# Patient Record
Sex: Female | Born: 1980 | Race: Black or African American | Hispanic: Yes | Marital: Single | Smoking: Current every day smoker
Health system: Southern US, Community
[De-identification: ages and names within clinical notes are randomized; demographics above are authoritative.]

## PROBLEM LIST (undated history)

## (undated) ENCOUNTER — Inpatient Hospital Stay: Payer: Self-pay

## (undated) ENCOUNTER — Emergency Department: Disposition: A | Discharge status: Home / Self Care | Payer: Self-pay

## (undated) DIAGNOSIS — Z789 Other specified health status: Secondary | ICD-10-CM

---

## 2004-09-06 ENCOUNTER — Emergency Department: Payer: Self-pay | Admitting: Emergency Medicine

## 2004-10-18 ENCOUNTER — Emergency Department: Payer: Self-pay | Admitting: Unknown Physician Specialty

## 2004-10-19 ENCOUNTER — Ambulatory Visit: Payer: Self-pay | Admitting: Unknown Physician Specialty

## 2007-05-14 ENCOUNTER — Observation Stay: Payer: Self-pay | Admitting: Obstetrics and Gynecology

## 2008-04-24 ENCOUNTER — Emergency Department: Payer: Self-pay | Admitting: Emergency Medicine

## 2008-05-27 ENCOUNTER — Emergency Department: Payer: Self-pay | Admitting: Emergency Medicine

## 2008-05-28 ENCOUNTER — Emergency Department: Payer: Self-pay | Admitting: Emergency Medicine

## 2008-12-18 ENCOUNTER — Observation Stay: Payer: Self-pay

## 2008-12-21 ENCOUNTER — Inpatient Hospital Stay: Payer: Self-pay

## 2009-03-26 ENCOUNTER — Emergency Department: Payer: Self-pay | Admitting: Emergency Medicine

## 2009-07-01 ENCOUNTER — Emergency Department: Payer: Self-pay | Admitting: Emergency Medicine

## 2010-10-13 ENCOUNTER — Emergency Department: Payer: Self-pay | Admitting: Internal Medicine

## 2011-12-14 ENCOUNTER — Emergency Department: Payer: Self-pay | Admitting: *Deleted

## 2011-12-16 ENCOUNTER — Emergency Department: Payer: Self-pay | Admitting: Emergency Medicine

## 2012-03-17 ENCOUNTER — Emergency Department: Payer: Self-pay | Admitting: *Deleted

## 2012-06-30 ENCOUNTER — Emergency Department (HOSPITAL_COMMUNITY)
Admission: EM | Admit: 2012-06-30 | Discharge: 2012-06-30 | Disposition: A | Discharge status: Home / Self Care | Payer: Self-pay | Attending: Emergency Medicine | Admitting: Emergency Medicine

## 2012-06-30 ENCOUNTER — Encounter (HOSPITAL_COMMUNITY): Payer: Self-pay | Admitting: Emergency Medicine

## 2012-06-30 DIAGNOSIS — F17200 Nicotine dependence, unspecified, uncomplicated: Secondary | ICD-10-CM | POA: Insufficient documentation

## 2012-06-30 DIAGNOSIS — K047 Periapical abscess without sinus: Secondary | ICD-10-CM | POA: Insufficient documentation

## 2012-06-30 MED ORDER — PENICILLIN V POTASSIUM 500 MG PO TABS: 500.0000 mg | ORAL_TABLET | Freq: Four times a day (QID) | ORAL | Status: AC

## 2012-06-30 NOTE — ED Notes (Signed)
Pt. Stated, the rt. Side of my face has been swollen for about a month,  My gums are sore also.

## 2012-06-30 NOTE — ED Provider Notes (Signed)
History   This chart was scribed for Wynetta Emery, PA, by Leone Payor, ED Scribe. This patient was seen in room TR10C/TR10C and the patient's care was started at 1445.    CSN: [id number]  Arrival date & time 06/30/12  1341   First MD Initiated Contact with Patient 06/30/12 1445      Chief Complaint  Patient presents with  . Facial Swelling     The history is provided by the patient. No language interpreter was used.    Suzanne Rhodes is a 32 y.o. female who presents to the Emergency Department complaining of gradually worsening, mild, right sided facial swelling with associated gum soreness starting 1 day ago. Pt reports having symptoms for 1 month but the facial swelling became worse last night. She denies fever, ear pain, tooth pain, cough, cuts to affected area.   Pt is a current everyday smoker but denies alcohol use.  History reviewed. No pertinent past medical history.  History reviewed. No pertinent past surgical history.  No family history on file.  History  Substance Use Topics  . Smoking status: Current Every Day Smoker  . Smokeless tobacco: Not on file  . Alcohol Use: No    No OB history provided.   Review of Systems  Constitutional: Negative for fever.  HENT: Negative for ear pain.   Respiratory: Negative for cough and shortness of breath.   Cardiovascular: Negative for chest pain.  Gastrointestinal: Negative for nausea, vomiting, abdominal pain and diarrhea.  Skin: Negative for wound.  All other systems reviewed and are negative.    Allergies  Review of patient's allergies indicates no known allergies.  Home Medications  No current outpatient prescriptions on file.  BP 135/81  Pulse 75  Temp 98.5 F (36.9 C) (Oral)  Resp 16  SpO2 100%  LMP 06/21/2012  Physical Exam  Nursing note and vitals reviewed. Constitutional: She is oriented to person, place, and time. She appears well-developed and well-nourished. No distress.  HENT:  Head:  Normocephalic.  Mouth/Throat:         TMs have good architecture with normal light reflex.   Eyes: Conjunctivae normal and EOM are normal.  Neck:       Anterior cervical focal lymphadenopathy.  Cardiovascular: Normal rate.   Pulmonary/Chest: Effort normal. No stridor.  Musculoskeletal: Normal range of motion.  Neurological: She is alert and oriented to person, place, and time.  Psychiatric: She has a normal mood and affect.    ED Course  Procedures (including critical care time)   DIAGNOSTIC STUDIES: Oxygen Saturation is 100% on room air, normal by my interpretation.    COORDINATION OF CARE: 3:43 PM Discussed treatment plan which includes antibiotics and follow up with dentist with pt at bedside and pt agreed to plan.     Labs Reviewed - No data to display No results found.   1. Dental abscess       MDM  Painless dental abscess with no systemic complaints.  I personally performed the services described in this documentation, which was scribed in my presence. The recorded information has been reviewed and is accurate.   Pt verbalized understanding and agrees with care plan. Outpatient follow-up and return precautions given.    New Prescriptions   PENICILLIN V POTASSIUM (VEETID) 500 MG TABLET    Take 1 tablet (500 mg total) by mouth 4 (four) times daily.    Wynetta Emery, PA-C 06/30/12 1555

## 2012-07-01 NOTE — ED Provider Notes (Signed)
Medical screening examination/treatment/procedure(s) were performed by non-physician practitioner and as supervising physician I was immediately available for consultation/collaboration.   Floy Angert E Chrisanne Loose, MD 07/01/12 1117 

## 2014-02-21 ENCOUNTER — Emergency Department: Payer: Self-pay | Admitting: Emergency Medicine

## 2014-02-21 LAB — CBC WITH DIFFERENTIAL/PLATELET
BASOS ABS: 0.1 10*3/uL (ref 0.0–0.1)
Basophil %: 0.8 %
EOS ABS: 0.1 10*3/uL (ref 0.0–0.7)
Eosinophil %: 1.1 %
HCT: 34.4 % — AB (ref 35.0–47.0)
HGB: 11.4 g/dL — ABNORMAL LOW (ref 12.0–16.0)
LYMPHS ABS: 1.9 10*3/uL (ref 1.0–3.6)
LYMPHS PCT: 14.8 %
MCH: 30.2 pg (ref 26.0–34.0)
MCHC: 33.1 g/dL (ref 32.0–36.0)
MCV: 91 fL (ref 80–100)
Monocyte #: 0.6 x10 3/mm (ref 0.2–0.9)
Monocyte %: 4.6 %
NEUTROS PCT: 78.7 %
Neutrophil #: 9.9 10*3/uL — ABNORMAL HIGH (ref 1.4–6.5)
PLATELETS: 229 10*3/uL (ref 150–440)
RBC: 3.77 10*6/uL — AB (ref 3.80–5.20)
RDW: 13 % (ref 11.5–14.5)
WBC: 12.6 10*3/uL — AB (ref 3.6–11.0)

## 2014-02-21 LAB — URINALYSIS, COMPLETE
BACTERIA: NONE SEEN
BILIRUBIN, UR: NEGATIVE
Glucose,UR: NEGATIVE mg/dL (ref 0–75)
KETONE: NEGATIVE
Nitrite: NEGATIVE
PROTEIN: NEGATIVE
Ph: 6 (ref 4.5–8.0)
RBC,UR: 14 /HPF (ref 0–5)
Specific Gravity: 1.02 (ref 1.003–1.030)
Squamous Epithelial: 12
WBC UR: 46 /HPF (ref 0–5)

## 2014-02-21 LAB — COMPREHENSIVE METABOLIC PANEL
Albumin: 3 g/dL — ABNORMAL LOW (ref 3.4–5.0)
Alkaline Phosphatase: 75 U/L
Anion Gap: 9 (ref 7–16)
BUN: 7 mg/dL (ref 7–18)
Bilirubin,Total: 0.2 mg/dL (ref 0.2–1.0)
CHLORIDE: 108 mmol/L — AB (ref 98–107)
Calcium, Total: 8.2 mg/dL — ABNORMAL LOW (ref 8.5–10.1)
Co2: 22 mmol/L (ref 21–32)
Creatinine: 0.54 mg/dL — ABNORMAL LOW (ref 0.60–1.30)
EGFR (Non-African Amer.): >60
Glucose: 74 mg/dL (ref 65–99)
OSMOLALITY: 274 (ref 275–301)
POTASSIUM: 3.5 mmol/L (ref 3.5–5.1)
SGOT(AST): 27 U/L (ref 15–37)
SGPT (ALT): 35 U/L
SODIUM: 139 mmol/L (ref 136–145)
Total Protein: 7.2 g/dL (ref 6.4–8.2)

## 2014-02-21 LAB — HCG, QUANTITATIVE, PREGNANCY: Beta Hcg, Quant.: 13504 m[IU]/mL — ABNORMAL HIGH

## 2014-02-21 LAB — LIPASE, BLOOD: Lipase: 56 U/L — ABNORMAL LOW (ref 73–393)

## 2014-04-14 ENCOUNTER — Ambulatory Visit: Payer: Self-pay | Admitting: Physician Assistant

## 2014-05-19 ENCOUNTER — Observation Stay: Payer: Self-pay

## 2014-05-25 ENCOUNTER — Inpatient Hospital Stay: Payer: Self-pay | Admitting: Obstetrics and Gynecology

## 2014-05-25 LAB — URINALYSIS, COMPLETE
Blood: NEGATIVE
GLUCOSE, UR: NEGATIVE mg/dL (ref 0–75)
NITRITE: NEGATIVE
Ph: 5 (ref 4.5–8.0)
RBC,UR: 39 /HPF (ref 0–5)
Specific Gravity: 1.032 (ref 1.003–1.030)
Transitional Epi: 1
WBC UR: 276 /HPF (ref 0–5)

## 2014-05-25 LAB — CBC WITH DIFFERENTIAL/PLATELET
BASOS ABS: 0 10*3/uL (ref 0.0–0.1)
BASOS PCT: 0.2 %
EOS ABS: 0.1 10*3/uL (ref 0.0–0.7)
EOS PCT: 0.8 %
HCT: 36.1 % (ref 35.0–47.0)
HGB: 12.1 g/dL (ref 12.0–16.0)
Lymphocyte #: 1.9 10*3/uL (ref 1.0–3.6)
Lymphocyte %: 17 %
MCH: 30.3 pg (ref 26.0–34.0)
MCHC: 33.5 g/dL (ref 32.0–36.0)
MCV: 90 fL (ref 80–100)
MONO ABS: 0.5 x10 3/mm (ref 0.2–0.9)
MONOS PCT: 4.9 %
Neutrophil #: 8.5 10*3/uL — ABNORMAL HIGH (ref 1.4–6.5)
Neutrophil %: 77.1 %
PLATELETS: 240 10*3/uL (ref 150–440)
RBC: 3.99 10*6/uL (ref 3.80–5.20)
RDW: 14.1 % (ref 11.5–14.5)
WBC: 11.1 10*3/uL — ABNORMAL HIGH (ref 3.6–11.0)

## 2014-05-25 LAB — HEMOGLOBIN A1C: Hemoglobin A1C: 5.6 % (ref 4.2–6.3)

## 2014-05-25 LAB — DRUG SCREEN, URINE
AMPHETAMINES, UR SCREEN: NEGATIVE (ref ?–1000)
Barbiturates, Ur Screen: NEGATIVE (ref ?–200)
Benzodiazepine, Ur Scrn: NEGATIVE (ref ?–200)
CANNABINOID 50 NG, UR ~~LOC~~: NEGATIVE (ref ?–50)
COCAINE METABOLITE, UR ~~LOC~~: POSITIVE (ref ?–300)
MDMA (ECSTASY) UR SCREEN: NEGATIVE (ref ?–500)
METHADONE, UR SCREEN: NEGATIVE (ref ?–300)
Opiate, Ur Screen: NEGATIVE (ref ?–300)
PHENCYCLIDINE (PCP) UR S: NEGATIVE (ref ?–25)
TRICYCLIC, UR SCREEN: NEGATIVE (ref ?–1000)

## 2014-05-25 LAB — GC/CHLAMYDIA PROBE AMP

## 2014-05-28 ENCOUNTER — Observation Stay: Payer: Self-pay | Admitting: Obstetrics and Gynecology

## 2014-05-28 LAB — URINALYSIS, COMPLETE
Bilirubin,UR: NEGATIVE
Nitrite: NEGATIVE
Ph: 5 (ref 4.5–8.0)
Protein: 30
SPECIFIC GRAVITY: 1.032 (ref 1.003–1.030)
Squamous Epithelial: 5

## 2014-05-28 LAB — DRUG SCREEN, URINE
AMPHETAMINES, UR SCREEN: NEGATIVE (ref ?–1000)
Barbiturates, Ur Screen: NEGATIVE (ref ?–200)
Benzodiazepine, Ur Scrn: NEGATIVE (ref ?–200)
CANNABINOID 50 NG, UR ~~LOC~~: NEGATIVE (ref ?–50)
Cocaine Metabolite,Ur ~~LOC~~: NEGATIVE (ref ?–300)
MDMA (ECSTASY) UR SCREEN: NEGATIVE (ref ?–500)
METHADONE, UR SCREEN: NEGATIVE (ref ?–300)
OPIATE, UR SCREEN: NEGATIVE (ref ?–300)
Phencyclidine (PCP) Ur S: NEGATIVE (ref ?–25)
TRICYCLIC, UR SCREEN: NEGATIVE (ref ?–1000)

## 2014-05-28 LAB — BETA STREP CULTURE(ARMC)

## 2014-05-29 LAB — FETAL FIBRONECTIN
Appearance: NORMAL
FETAL FIBRONECTIN: POSITIVE

## 2014-05-30 ENCOUNTER — Inpatient Hospital Stay: Payer: Self-pay | Admitting: Obstetrics and Gynecology

## 2014-05-30 LAB — CBC WITH DIFFERENTIAL/PLATELET
BASOS PCT: 0.3 %
Basophil #: 0 10*3/uL (ref 0.0–0.1)
EOS ABS: 0 10*3/uL (ref 0.0–0.7)
EOS PCT: 0 %
HCT: 30.2 % — ABNORMAL LOW (ref 35.0–47.0)
HGB: 9.9 g/dL — AB (ref 12.0–16.0)
LYMPHS PCT: 7.6 %
Lymphocyte #: 1.3 10*3/uL (ref 1.0–3.6)
MCH: 30.1 pg (ref 26.0–34.0)
MCHC: 32.8 g/dL (ref 32.0–36.0)
MCV: 92 fL (ref 80–100)
MONO ABS: 0.9 x10 3/mm (ref 0.2–0.9)
Monocyte %: 5.5 %
NEUTROS ABS: 14.7 10*3/uL — AB (ref 1.4–6.5)
NEUTROS PCT: 86.6 %
Platelet: 191 10*3/uL (ref 150–440)
RBC: 3.3 10*6/uL — ABNORMAL LOW (ref 3.80–5.20)
RDW: 13.7 % (ref 11.5–14.5)
WBC: 17 10*3/uL — ABNORMAL HIGH (ref 3.6–11.0)

## 2014-05-31 LAB — HEMOGLOBIN: HGB: 9.1 g/dL — AB (ref 12.0–16.0)

## 2014-10-10 NOTE — Op Note (Signed)
PATIENT NAME:  Suzanne Rhodes, Suzanne Rhodes MR#:  [id number] DATE OF BIRTH:  08-12-1980  DATE OF PROCEDURE:  05/30/2014  PREOPERATIVE DIAGNOSES:   1.  At 32 plus 0 weeks estimated gestational age.  2.  Preterm premature rupture of membranes.  3.  Prolapsed umbilical cord.  4.  Breech presentation.   POSTOPERATIVE DIAGNOSES: 1.  At 32 plus 0 weeks estimated gestational age.  2.  Preterm premature rupture of membranes.  3.  Prolapsed umbilical cord.  4.  Breech presentation.   PROCEDURE:  Emergent low transverse cesarean section.   ANESTHESIA:  General endotracheal anesthesia.   SURGEON:  Suzy Bouchardhomas J. Danett Palazzo, MD  INDICATIONS:  A 34 year old gravida 5, para 1-3-0-4 who presented at 32 weeks with preceding rupture of membranes 50 minutes prior to her presentation to labor and delivery.  The patient's cervical check demonstrated a prolapsed umbilical cord, and the patient was emergently taken to the operating room.   DESCRIPTION OF PROCEDURE:  Nursing remained at the patient's perineum with a vaginal hand to elevate the presenting part from the cervix while the patient underwent general endotracheal anesthesia. The patient's abdomen had previously prepped. Once the patient was intubated, a Pfannenstiel incision was made 2 fingerbreadths above the symphysis pubis. Sharp dissection was used to identify the fascia. The fascia was opened in the midline. Blunt traction allowed entrance into the peritoneal cavity.  A low transverse uterine incision was made and clear fluid resulted. Fetal buttocks, followed by arms and head were delivered.  A floppy female was passed to nursery staff. The patient's placenta was manually delivered and after clearing some omental adhesions from the uterus, the uterus was exteriorized and wiped clean with laparotomy tape. The uterine incision was closed with 1 chromic suture in a running locking fashion. There was extension on the left cervical side that required additional  figure-of-eight sutures. Good hemostasis was noted. Fallopian tubes and ovaries appeared normal. The posterior cul-de-sac was irrigated and suctioned, and the uterus was placed back into the abdominal cavity. The uterine incision, again, appeared hemostatic. The fascia was closed with 0 Vicryl suture in a running, nonlocking fashion with good approximation of edges. Subcutaneous tissues were irrigated and bovied for hemostasis, and the skin was reapproximated with staples. The patient received 2 grams of cefoxitin prior to commencement of the case. An additional 80 mg of gentamicin was added, given the emergent nature of the procedure. The patient tolerated the procedure well.   ESTIMATED BLOOD LOSS:  800 mL.  INTRAOPERATIVE FLUIDS:  1000 mL.  DRAINS AND TUBES:  A Foley catheter was placed at the end of the case.    COUNTS:  The patient will undergo an x-ray to rule out any instruments, needles, or sponges left within the patient at the end of the case.    ____________________________ Suzy Bouchardhomas J. Zephyr Ridley, MD tjs:mw D: 05/30/2014 04:55:43 ET T: 05/30/2014 07:32:21 ET JOB#: [id number]  cc: Suzy Bouchardhomas J. Angellynn Kimberlin, MD, <Dictator> Suzy BouchardHOMAS J Lowell Mcgurk MD ELECTRONICALLY SIGNED 05/31/2014 11:02

## 2014-10-10 NOTE — H&P (Signed)
PATIENT NAME:  Suzanne Rhodes, [name] MR#:  [id number] DATE OF BIRTH:  1980/08/02  DATE OF ADMISSION:  05/30/2014  HISTORY OF PRESENT ILLNESS: This is a 34 year old gravida 5, para 1-3-0-4 who presented to labor and delivery with rupture of membranes at home. The patient came through the Emergency Department at [facility]. Upon examination by nurse at 0326 in the morning, the patient was noted to be breech presentation with a prolapsed cord. Fetal heart rate was down and emergent cesarean section was called. I was contacted via pager at [phone] and the patient was readied for the operating room. Nursing; remained with the patient with a vaginal hand to aid in reduction of the buttocks against the cervix.   PRIOR HISTORY:  The patient was seen on 05/25/2014 where she was noted to be 4 cm dilated, 90%,  (unsure presentation) <<unsure presentation> . The patient left against medical advice after being counseled regarding the risk of preterm delivery. The patient did have cocaine on her urine drug screen on that night. The patient is 32 plus 0 weeks based on last menstrual period and a confirmatory 35 plus 3 ultrasound. The patient has had history of Chlamydia, tobacco use, elevated 1 hour Glucola test.   PAST MEDICAL HISTORY: Preterm deliveries at 6 months, 6 months, 7 months.   PAST SURGICAL HISTORY: None.   REVIEW OF SYSTEMS: Unremarkable.   ALLERGIES: No known drug allergies.   SOCIAL HISTORY: Positive tobacco. Positive drug use including cocaine   FAMILY HISTORY: Noncontributory.   PHYSICAL EXAMINATION:  GENERAL: Well developed, well nourished black female.  VITAL SIGNS: Vital signs stable.  LUNGS: Clear to auscultation.  CARDIOVASCULAR: Regular rate and rhythm.  ABDOMEN: Gravid, cervix per nursing dilation with prolapsed umbilical cord, breech presentation.   ASSESSMENT: Preterm premature rupture of membranes with breech presentation and prolapsed umbilical cord.   PLAN:   Emergent low transverse cesarean section via general anesthetic.    ____________________________ [name], MD tjs:aw D: 05/30/2014 04:50:50 ET T: 05/30/2014 05:09:44 ET JOB#: [id number]  cc: [name], MD, <Dictator> Suzy BouchardHOMAS J SCHERMERHORN MD ELECTRONICALLY SIGNED 05/31/2014 11:01

## 2014-10-12 LAB — SURGICAL PATHOLOGY

## 2014-10-27 NOTE — H&P (Signed)
L&D Evaluation:  History:  HPI 34 yo G5P4 at 31+2 weeks by LMP and confirmatory 25 +3 week u/s presents to L+D with pelvic pain and contractions . NO LOF . Some spotting . [facility] . Pt was here on 05/19/14 and left AMA before cervix check and full evaluation . Pt has had 3 other PTD at " 6 mo., 79mo., and 7 mo.". + chlamydia ( txed+ partner ) ., + tobacco use , elevated 1hr GTT . + cocaine on UDS tonight  Cx 4cm / 90 % /Blt ? presenting part .   Presents with abdominal pain   Patient's Medical History No Chronic Illness   Patient's Surgical History none   Medications Pre Natal Vitamins   Allergies NKDA   Social History tobacco  drugs  cocaineon UDS   Family History Non-Contributory   ROS:  ROS All systems were reviewed.  HEENT, CNS, GI, GU, Respiratory, CV, Renal and Musculoskeletal systems were found to be normal., OB HX + PTD x 3   Exam:  General no apparent distress   Chest clear   Heart normal sinus rhythm   Abdomen gravid, non-tender   Back no CVAT   Edema no edema   Pelvic 4 cm / 90 / blt   Mebranes Intact   FHT normal rate with no decels   Ucx irritability   Impression:  Impression PTL, 31+2 weeks   Plan:  Plan admit , IV tocolysis, IV magnesium sulfate tocolysis / neuroprotection , IM betamethasone for fetal lung maturity , Iv antibiotics for unknown GBS status. . risks of prematyurity breifly d/w patient . Will get hg A1C on blood tonight ., Continous monitoring for now   Electronic Signatures: Suzanne Rhodes, Suzanne Rhodes (MD)  (Signed 07-Dec-15 19:26)  Authored: L&D Evaluation   Last Updated: 07-Dec-15 19:26 by Suzy BouchardSchermerhorn, Treniya Lobb Rhodes (MD)

## 2014-10-27 NOTE — H&P (Signed)
L&D Evaluation:  History:  HPI 34 yo G5P4 at 31+5 weeks by LMP and confirmatory 25 +3 week u/s presents to L+D with pelvic pain and contractions. She was seen in triage 3 days ago for same. Plan at that time was steroid, mag, but patient went home AMA to take of other kids.  NO LOF . Some spotting . Poor [facility] . Pt was here on 05/19/14 and left AMA before cervix check and full evaluation . Pt has had 3 other PTD at " 6 mo., 45mo., and 7 mo.". + chlamydia ( txed+ partner ) ., + tobacco use , elevated 1hr GTT . + cocaine on UDS 3 days ago, no cocaine today. Cx 4cm / 90 % /Blt ? presenting part .   Presents with abdominal pain, back pain   Patient's Medical History No Chronic Illness   Patient's Surgical History none   Medications Pre Natal Vitamins   Allergies NKDA   Social History tobacco  drugs  cocaine on UDS   Family History Non-Contributory   ROS:  ROS All systems were reviewed.  HEENT, CNS, GI, GU, Respiratory, CV, Renal and Musculoskeletal systems were found to be normal., OB HX + PTD x 3   Exam:  Vital Signs stable   General no apparent distress   Chest clear   Heart normal sinus rhythm   Abdomen gravid, non-tender   Back no CVAT   Edema no edema   Pelvic 4 cm / 90 / blt   Mebranes Intact   FHT normal rate with no decels   Ucx irritability   Impression:  Impression PTL, 31+5 weeks   Plan:  Plan admit ,  IV magnesium sulfate tocolysis / neuroprotection , IM betamethasone for fetal lung maturity , Iv antibiotics for unknown GBS status. . risks of prematurity d/w patient at length . Continous monitoring for now   Comments Hgb A1C 5.6  Pt did state that if she wasn't going to have the baby tonight, she needs to go home.   Electronic Signatures: Cline CoolsBeasley, Kadelyn Dimascio E (MD)  (Signed 10-Dec-15 23:03)  Authored: L&D Evaluation   Last Updated: 10-Dec-15 23:03 by Cline CoolsBeasley, Doy Taaffe E (MD)

## 2015-02-23 ENCOUNTER — Encounter: Payer: Self-pay | Admitting: Emergency Medicine

## 2015-02-23 ENCOUNTER — Emergency Department
Admission: EM | Admit: 2015-02-23 | Discharge: 2015-02-23 | Disposition: A | Discharge status: Home / Self Care | Payer: Medicaid Other | Attending: Emergency Medicine | Admitting: Emergency Medicine

## 2015-02-23 DIAGNOSIS — S46911A Strain of unspecified muscle, fascia and tendon at shoulder and upper arm level, right arm, initial encounter: Secondary | ICD-10-CM | POA: Diagnosis not present

## 2015-02-23 DIAGNOSIS — M62838 Other muscle spasm: Secondary | ICD-10-CM

## 2015-02-23 DIAGNOSIS — Y9289 Other specified places as the place of occurrence of the external cause: Secondary | ICD-10-CM | POA: Diagnosis not present

## 2015-02-23 DIAGNOSIS — X58XXXA Exposure to other specified factors, initial encounter: Secondary | ICD-10-CM | POA: Insufficient documentation

## 2015-02-23 DIAGNOSIS — Y990 Civilian activity done for income or pay: Secondary | ICD-10-CM | POA: Diagnosis not present

## 2015-02-23 DIAGNOSIS — Z720 Tobacco use: Secondary | ICD-10-CM | POA: Diagnosis not present

## 2015-02-23 DIAGNOSIS — Y9389 Activity, other specified: Secondary | ICD-10-CM | POA: Diagnosis not present

## 2015-02-23 DIAGNOSIS — S4991XA Unspecified injury of right shoulder and upper arm, initial encounter: Secondary | ICD-10-CM | POA: Diagnosis present

## 2015-02-23 DIAGNOSIS — T148XXA Other injury of unspecified body region, initial encounter: Secondary | ICD-10-CM

## 2015-02-23 MED ORDER — DIAZEPAM 5 MG PO TABS
10.0000 mg | ORAL_TABLET | Freq: Once | ORAL | Status: AC
  Administered 2015-02-23: 10 mg via ORAL
  Filled 2015-02-23: qty 2

## 2015-02-23 MED ORDER — DIAZEPAM 5 MG PO TABS: 5.0000 mg | ORAL_TABLET | Freq: Three times a day (TID) | ORAL | Status: DC | PRN

## 2015-02-23 MED ORDER — IBUPROFEN 800 MG PO TABS
800.0000 mg | ORAL_TABLET | Freq: Once | ORAL | Status: AC
  Administered 2015-02-23: 800 mg via ORAL
  Filled 2015-02-23: qty 1

## 2015-02-23 MED ORDER — IBUPROFEN 800 MG PO TABS: 800.0000 mg | ORAL_TABLET | Freq: Three times a day (TID) | ORAL | Status: DC | PRN

## 2015-02-23 NOTE — ED Provider Notes (Signed)
[facility] Emergency Department Provider Note     Time seen: ----------------------------------------- 1:06 PM on 02/23/2015 -----------------------------------------    I have reviewed the triage vital signs and the nursing notes.   HISTORY  Chief Complaint Shoulder Pain    HPI Suzanne Rhodes is a 34 y.o. female who presents ER with right shoulder pain for several months. Worse over the last 3 days. She states she is left handed, carries her son who is 25 pounds in her right arm most of the time. 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   History reviewed. No pertinent past medical history.  There are no active problems to display for this patient.   History reviewed. No pertinent past surgical history.  Allergies Review of patient's allergies indicates no known allergies.  Social History Social History  Substance Use Topics  . Smoking status: Current Every Day Smoker  . Smokeless tobacco: None  . Alcohol Use: No    Review of Systems Constitutional: Negative for fever. Eyes: Negative for visual changes. ENT: Negative for sore throat. Cardiovascular: Negative for chest pain. Respiratory: Negative for shortness of breath. Gastrointestinal: Negative for abdominal pain, vomiting and diarrhea. Genitourinary: Negative for dysuria. Musculoskeletal: Positive for right shoulder pain Skin: Negative for rash. Neurological: Negative for headaches, focal weakness or numbness.  10-point ROS otherwise negative.  ____________________________________________   PHYSICAL EXAM:  VITAL SIGNS: ED Triage Vitals  Enc Vitals Group     BP 02/23/15 1137 138/73 mmHg     Pulse Rate 02/23/15 1137 108     Resp 02/23/15 1137 20     Temp 02/23/15 1137 98.3 F (36.8 C)     Temp Source 02/23/15 1137 Oral     SpO2 02/23/15 1137 96 %     Weight 02/23/15 1137 140 lb (63.504 kg)     Height 02/23/15 1137  (1.575 m)     Head Cir --    Peak Flow --      Pain Score 02/23/15 1138 8     Pain Loc --      Pain Edu? --      Excl. in GC? --     Constitutional: Alert and oriented. Well appearing and in no distress. Musculoskeletal: Mildly decreased range of motion of the right shoulder, there is palpable trapezius muscle spasm on the right Neurologic:  Normal speech and language. No gross focal neurologic deficits are appreciated. Speech is normal. No gait instability. Skin:  Skin is warm, dry and intact. No rash noted. Psychiatric: Mood and affect are normal. Speech and behavior are normal. Patient exhibits appropriate insight and judgment.  ____________________________________________  ED COURSE:  Pertinent labs & imaging results that were available during my care of the patient were reviewed by me and considered in my medical decision making (see chart for details). She is likely symptomatically from carrying her 25 pound son in her right arm. She has muscle strain and spasm in the right shoulder and neck accordingly  ____________________________________________  FINAL ASSESSMENT AND PLAN  Muscle strain, spasm  Plan: Patient with labs and imaging as dictated above. Patient be discharged with Motrin and Valium. She is encouraged to use heating pad, massage stretching. She is advised to return for worsening or worrisome symptoms.   [name], MD   [name], MD 02/23/15 [phone]

## 2015-02-23 NOTE — Discharge Instructions (Signed)

## 2015-02-23 NOTE — ED Notes (Signed)
Right shoulder pain for several months worse x 3 days

## 2015-05-24 IMAGING — CR DG ABDOMEN 1V
1 series · 2 of 2 positions shown · non-contrast
Comparison: None.

CLINICAL DATA: Foreign body.

EXAM:
ABDOMEN - 1 VIEW

[Series 1: ap · 0.17mm/px · 2 of 2 slices shown]
[im 1/2]
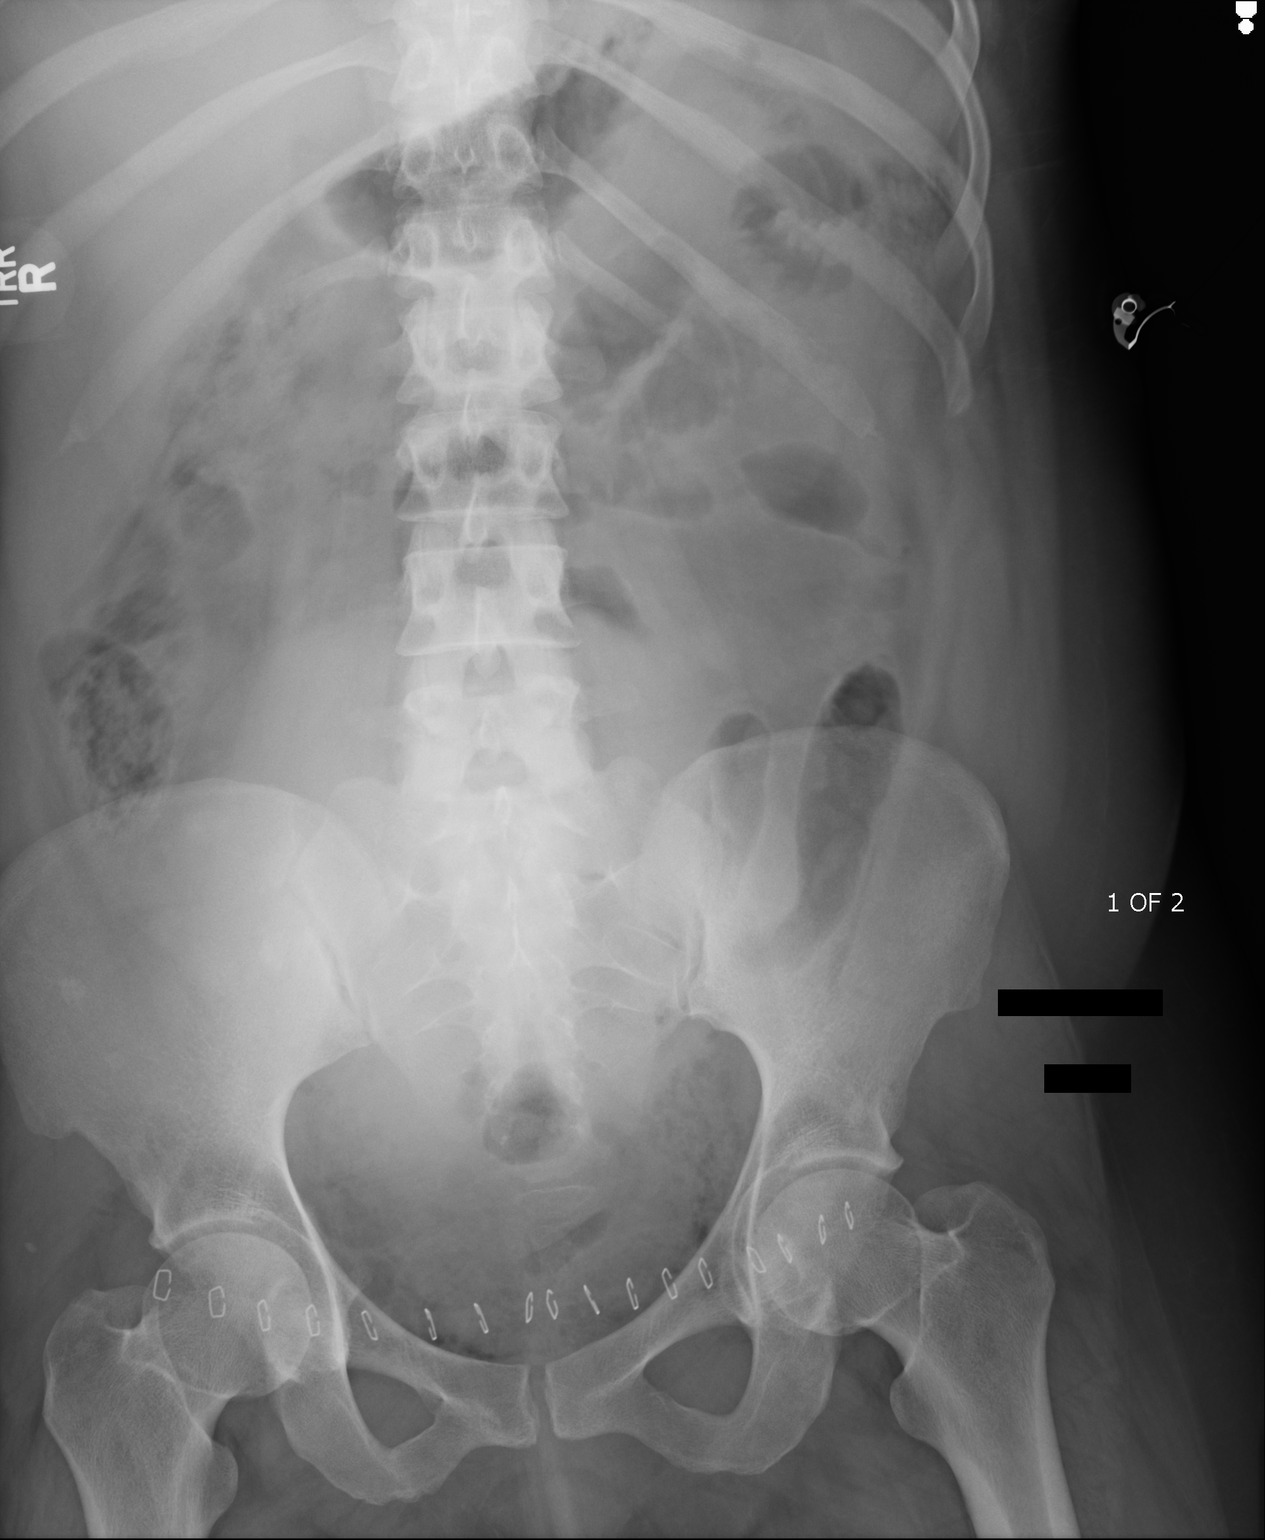
[im 2/2]
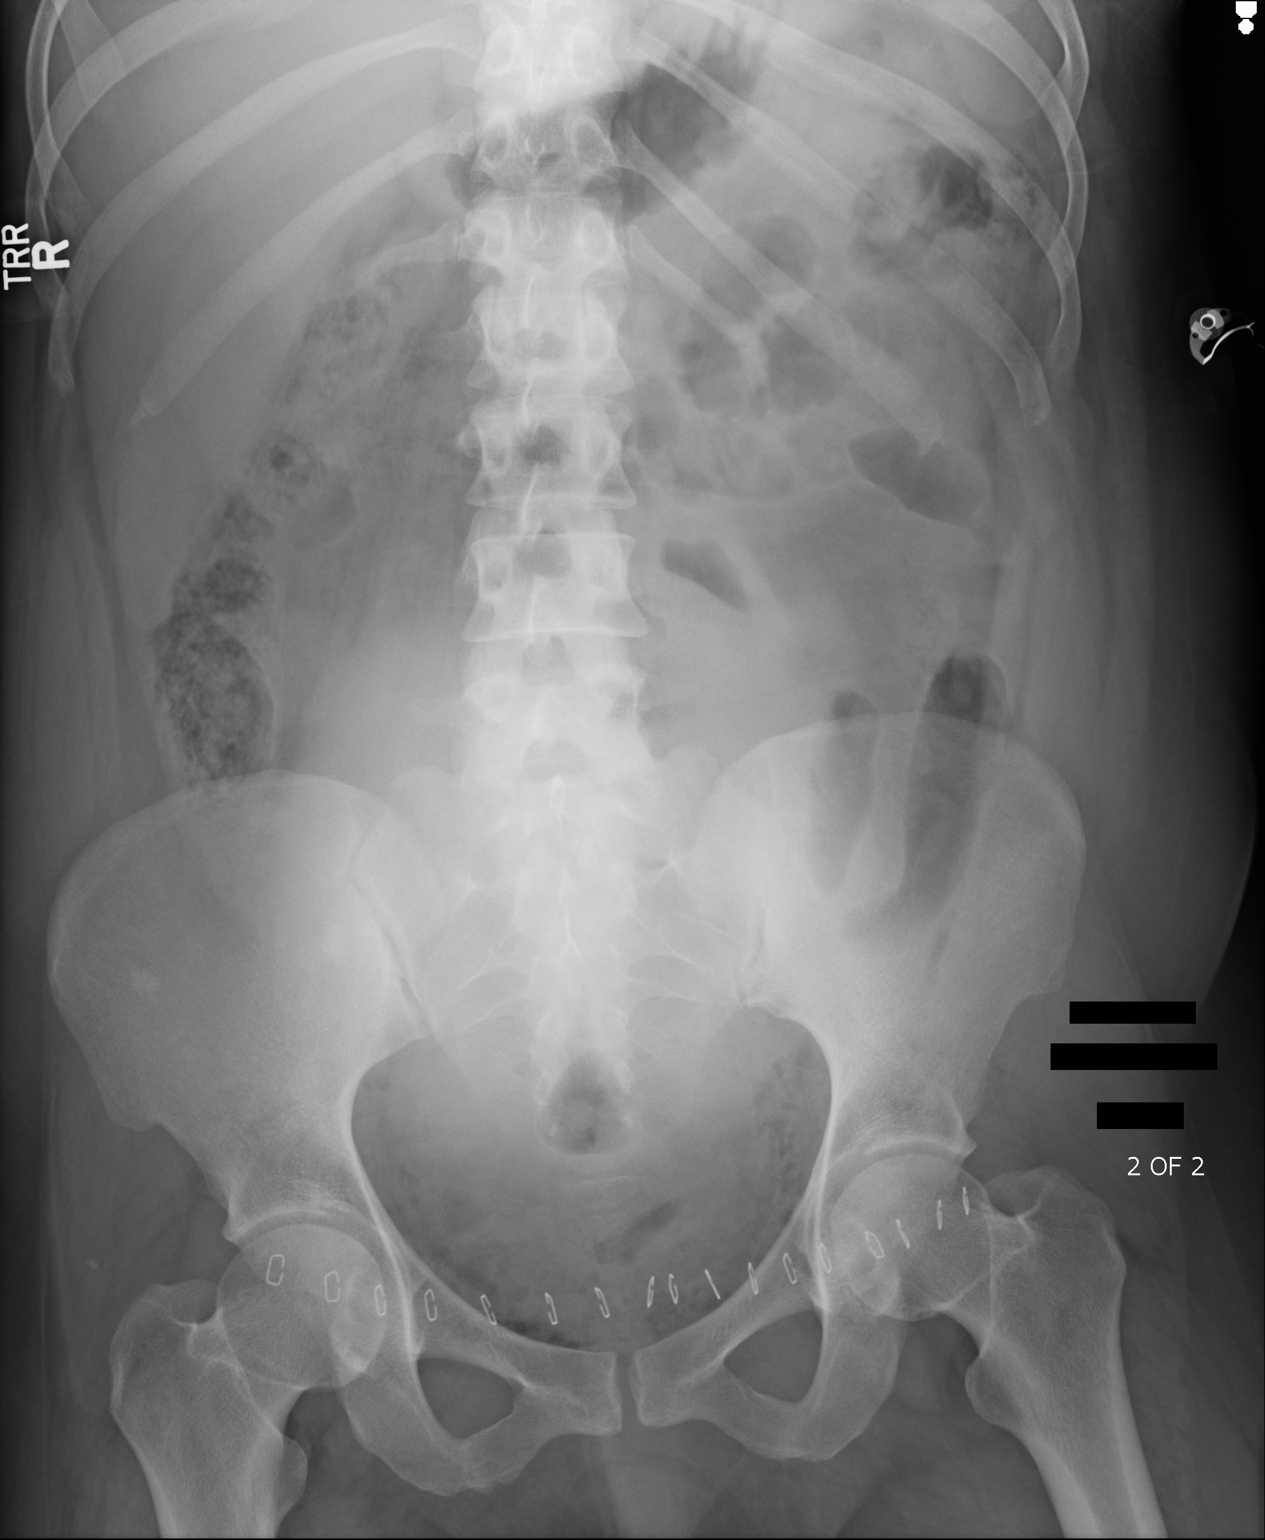

[2 of 2 positions shown; findings below may reference images not displayed]

FINDINGS: Skin clips over the pelvis consistent with recent surgery. Normal
bowel gas pattern with scattered gas and stool in the colon and a
few gas-filled nondistended small bowel loops. No radiopaque foreign
bodies demonstrated. Visualized bones appear intact.
IMPRESSION: No radiopaque foreign bodies demonstrated.

## 2016-06-19 NOTE — L&D Delivery Note (Addendum)
Date of delivery: 12/16/16 Estimated Date of Delivery: 02/08/17 Patient's last menstrual period was 05/04/2016. EGA: [id number]  Delivery Note At 4:45 PM a viable female was delivered via VBAC, Spontaneous (Presentation: LOA ).  APGAR: 8, 9 ; weight 4 lb 2 oz (1871 g).   Placenta status: spontaneous, intact - see below.  Cord: 3vv, with the following complications: none apparent.  Cord pH: not collected  Anesthesia:  none Episiotomy: None Lacerations:  none Suture Repair: n/a Est. Blood Loss (mL): 20cc  Mom presented to L&D at 31+3 weeks with PPROM.  Left AMA.  Returned today with contractions, in labor found to be 6cm. Progressed to complete, second stage: <1095min.  delivery of fetal head with restitution to LOT.   Anterior then posterior shoulders delivered without difficulty.  Short cord. Baby placed below level of placenta and delayed cord clamping for 60sec.  Baby was vigorous. During this time we sang excerpts of "Ree KidaJack and Diane" by Silvio PateJohn Cougar.  Cord was then clamped and cut by FOB Dishawn.  Placenta spontaneously delivered, intact. Sent to pathology.   IM pitocin given for hemorrhage prophylaxis.  Baby was attended to by the NICU team, then brought to NICU for placement.  There, we sang happy birthday to Diane.  Mom to postpartum.  Baby to NICU.  Rome Schlauch C Lafonda Patron 12/16/2016, 5:42 PM

## 2016-09-13 ENCOUNTER — Emergency Department
Admission: EM | Admit: 2016-09-13 | Discharge: 2016-09-13 | Discharge status: Against Medical Advice | Payer: Self-pay | Attending: Emergency Medicine | Admitting: Emergency Medicine

## 2016-09-13 ENCOUNTER — Encounter: Payer: Self-pay | Admitting: Emergency Medicine

## 2016-09-13 ENCOUNTER — Emergency Department: Payer: Self-pay

## 2016-09-13 DIAGNOSIS — F17200 Nicotine dependence, unspecified, uncomplicated: Secondary | ICD-10-CM | POA: Insufficient documentation

## 2016-09-13 DIAGNOSIS — Z3A19 19 weeks gestation of pregnancy: Secondary | ICD-10-CM | POA: Insufficient documentation

## 2016-09-13 DIAGNOSIS — N898 Other specified noninflammatory disorders of vagina: Secondary | ICD-10-CM | POA: Insufficient documentation

## 2016-09-13 DIAGNOSIS — O99332 Smoking (tobacco) complicating pregnancy, second trimester: Secondary | ICD-10-CM | POA: Insufficient documentation

## 2016-09-13 DIAGNOSIS — O4690 Antepartum hemorrhage, unspecified, unspecified trimester: Secondary | ICD-10-CM

## 2016-09-13 DIAGNOSIS — O209 Hemorrhage in early pregnancy, unspecified: Secondary | ICD-10-CM | POA: Insufficient documentation

## 2016-09-13 LAB — URINALYSIS, COMPLETE (UACMP) WITH MICROSCOPIC
BACTERIA UA: NONE SEEN
BILIRUBIN URINE: NEGATIVE
Glucose, UA: NEGATIVE mg/dL
KETONES UR: NEGATIVE mg/dL
LEUKOCYTES UA: NEGATIVE
NITRITE: NEGATIVE
PROTEIN: 30 mg/dL — AB
Specific Gravity, Urine: 1.025 (ref 1.005–1.030)
pH: 7 (ref 5.0–8.0)

## 2016-09-13 LAB — CBC WITH DIFFERENTIAL/PLATELET
BASOS PCT: 0 %
Basophils Absolute: 0 10*3/uL (ref 0–0.1)
EOS ABS: 0.1 10*3/uL (ref 0–0.7)
EOS PCT: 1 %
HCT: 34 % — ABNORMAL LOW (ref 35.0–47.0)
HEMOGLOBIN: 11.8 g/dL — AB (ref 12.0–16.0)
Lymphocytes Relative: 14 %
Lymphs Abs: 1.4 10*3/uL (ref 1.0–3.6)
MCH: 30.6 pg (ref 26.0–34.0)
MCHC: 34.6 g/dL (ref 32.0–36.0)
MCV: 88.6 fL (ref 80.0–100.0)
MONOS PCT: 4 %
Monocytes Absolute: 0.4 10*3/uL (ref 0.2–0.9)
NEUTROS PCT: 81 %
Neutro Abs: 8.6 10*3/uL — ABNORMAL HIGH (ref 1.4–6.5)
PLATELETS: 233 10*3/uL (ref 150–440)
RBC: 3.84 MIL/uL (ref 3.80–5.20)
RDW: 12.9 % (ref 11.5–14.5)
WBC: 10.6 10*3/uL (ref 3.6–11.0)

## 2016-09-13 LAB — HCG, QUANTITATIVE, PREGNANCY: hCG, Beta Chain, Quant, S: 13248 m[IU]/mL — ABNORMAL HIGH (ref <5)

## 2016-09-13 LAB — ABO/RH: ABO/RH(D): O POS

## 2016-09-13 NOTE — ED Notes (Signed)
Pt reports brown vaginal discharge last night, pt denies any vaginal bleeding, denies any pain.  Pt is pregnant but is unaware how far along she is, denies any previous prenatal care.

## 2016-09-13 NOTE — ED Provider Notes (Signed)
[facility] Emergency Department Provider Note       Time seen: ----------------------------------------- 2:28 PM on 09/13/2016 -----------------------------------------     I have reviewed the triage vital signs and the nursing notes.   HISTORY   Chief Complaint Vaginal Bleeding    HPI Suzanne Rhodes is a 36 y.o. female who presents to the ED for who presents to the ER for vaginal bleeding. Patient noticed vaginal bleeding after urination twice last night. She is currently [redacted] weeks pregnant and describes the blood as brown in color, denies any specific pain or cramping. Again, she has no pain, no fever or other complaints. She is G6 P5 Ab0, she is not sure how far along she is.   History reviewed. No pertinent past medical history.  There are no active problems to display for this patient.   History reviewed. No pertinent surgical history.  Allergies Patient has no known allergies.  Social History Social History  Substance Use Topics  . Smoking status: Current Every Day Smoker  . Smokeless tobacco: Never Used  . Alcohol use No    Review of Systems Constitutional: Negative for fever. Cardiovascular: Negative for chest pain. Respiratory: Negative for shortness of breath. Gastrointestinal: Negative for abdominal pain, vomiting and diarrhea. Genitourinary: Positive for vaginal bleeding Musculoskeletal: Negative for back pain. Skin: Negative for rash. Neurological: Negative for headaches, focal weakness or numbness.  10-point ROS otherwise negative.  ____________________________________________   PHYSICAL EXAM:  VITAL SIGNS: ED Triage Vitals  Enc Vitals Group     BP 09/13/16 1124 127/63     Pulse Rate 09/13/16 1124 84     Resp 09/13/16 1124 16     Temp 09/13/16 1124 98.1 F (36.7 C)     Temp Source 09/13/16 1124 Oral     SpO2 09/13/16 1124 100 %     Weight 09/13/16 1124 150 lb (68 kg)     Height 09/13/16 1124 5\' 2"  (1.575 m)      Head Circumference --      Peak Flow --      Pain Score 09/13/16 1129 0     Pain Loc --      Pain Edu? --      Excl. in GC? --     Constitutional: Alert and oriented. Well appearing and in no distress. Eyes: Conjunctivae are normal. PERRL. Normal extraocular movements. ENT   Head: Normocephalic and atraumatic.   Nose: No congestion/rhinnorhea.   Mouth/Throat: Mucous membranes are moist.   Neck: No stridor. Cardiovascular: Normal rate, regular rhythm. No murmurs, rubs, or gallops. Respiratory: Normal respiratory effort without tachypnea nor retractions. Breath sounds are clear and equal bilaterally. No wheezes/rales/rhonchi. Gastrointestinal: Soft and nontender. Normal bowel sounds, fundal height approaches the umbilicus Musculoskeletal: Nontender with normal range of motion in extremities. No lower extremity tenderness nor edema. Neurologic:  Normal speech and language. No gross focal neurologic deficits are appreciated.  Skin:  Skin is warm, dry and intact. No rash noted. Psychiatric: Mood and affect are normal. Speech and behavior are normal.  ____________________________________________  ED COURSE:  Pertinent labs & imaging results that were available during my care of the patient were reviewed by me and considered in my medical decision making (see chart for details). Patient presents for vaginal bleeding in pregnancy, we will assess with labs and imaging as indicated.   Procedures ____________________________________________   LABS (pertinent positives/negatives)  Labs Reviewed  HCG, QUANTITATIVE, PREGNANCY - Abnormal; Notable for the following:  Result Value   hCG, Beta Chain, Quant, S 13,248 (*)    All other components within normal limits  URINALYSIS, COMPLETE (UACMP) WITH MICROSCOPIC - Abnormal; Notable for the following:    Color, Urine AMBER (*)    APPearance HAZY (*)    Hgb urine dipstick SMALL (*)    Protein, ur 30 (*)    Squamous  Epithelial / LPF 6-30 (*)    All other components within normal limits  CBC WITH DIFFERENTIAL/PLATELET - Abnormal; Notable for the following:    Hemoglobin 11.8 (*)    HCT 34.0 (*)    Neutro Abs 8.6 (*)    All other components within normal limits  ABO/RH    RADIOLOGY Images were viewed by me  Pregnancy ultrasound IMPRESSION: 1. Single living intrauterine gestation. The estimated gestational age is 19 weeks and 2 days.  This exam is performed on an emergent basis and does not comprehensively evaluate fetal size, dating, or anatomy; follow-up complete OB US should be considered if further fetal assessment is warranted. ____________________________________________  FINAL ASSESSMENT AND PLAN  Threatened miscarriage  Plan: Patient's labs and imaging were dictated above. Patient had presented for Possible vaginal bleeding and proceed. Patient had some brown discharge. Ultrasound was reassuring but she eloped prior to ultrasound report and repeat examination.   Suzanne Filbert, MD   Note: This note was generated in part or whole with voice recognition software. Voice recognition is usually quite accurate but there are transcription errors that can and very often do occur. I apologize for any typographical errors that were not detected and corrected.     Suzanne Filbert, MD 09/13/16 [phone]

## 2016-09-13 NOTE — ED Notes (Signed)
Pt was not present in the treatment room, the appearance that the pat has eloped from the room , Dr.Williams aware.

## 2016-09-13 NOTE — ED Notes (Signed)
Pt states she has to leave at 2:30 to pick up her daughter.

## 2016-09-13 NOTE — ED Triage Notes (Signed)
Pt to ED via POV with c/o vaginal bleeding noticed after urination twice last night. Pt is currently 19wks preg. Pt describes blood as brown in color. Pt denies any abd pain or cramping. Pt in NAD at this time

## 2016-10-02 DIAGNOSIS — O99332 Smoking (tobacco) complicating pregnancy, second trimester: Secondary | ICD-10-CM | POA: Insufficient documentation

## 2016-12-10 ENCOUNTER — Encounter: Payer: Self-pay | Admitting: *Deleted

## 2016-12-10 ENCOUNTER — Observation Stay
Admission: EM | Admit: 2016-12-10 | Discharge: 2016-12-10 | Discharge status: Against Medical Advice | Payer: Medicaid Other | Attending: Obstetrics & Gynecology | Admitting: Obstetrics & Gynecology

## 2016-12-10 DIAGNOSIS — O09899 Supervision of other high risk pregnancies, unspecified trimester: Secondary | ICD-10-CM

## 2016-12-10 DIAGNOSIS — O99333 Smoking (tobacco) complicating pregnancy, third trimester: Secondary | ICD-10-CM | POA: Insufficient documentation

## 2016-12-10 DIAGNOSIS — O09893 Supervision of other high risk pregnancies, third trimester: Secondary | ICD-10-CM | POA: Insufficient documentation

## 2016-12-10 DIAGNOSIS — O0933 Supervision of pregnancy with insufficient antenatal care, third trimester: Secondary | ICD-10-CM | POA: Insufficient documentation

## 2016-12-10 DIAGNOSIS — O9989 Other specified diseases and conditions complicating pregnancy, childbirth and the puerperium: Secondary | ICD-10-CM | POA: Diagnosis present

## 2016-12-10 DIAGNOSIS — Z3A31 31 weeks gestation of pregnancy: Secondary | ICD-10-CM | POA: Diagnosis not present

## 2016-12-10 DIAGNOSIS — Z5321 Procedure and treatment not carried out due to patient leaving prior to being seen by health care provider: Secondary | ICD-10-CM | POA: Diagnosis not present

## 2016-12-10 DIAGNOSIS — O0930 Supervision of pregnancy with insufficient antenatal care, unspecified trimester: Secondary | ICD-10-CM

## 2016-12-10 DIAGNOSIS — O0993 Supervision of high risk pregnancy, unspecified, third trimester: Secondary | ICD-10-CM

## 2016-12-10 DIAGNOSIS — Z0371 Encounter for suspected problem with amniotic cavity and membrane ruled out: Secondary | ICD-10-CM | POA: Diagnosis not present

## 2016-12-10 DIAGNOSIS — O09219 Supervision of pregnancy with history of pre-term labor, unspecified trimester: Secondary | ICD-10-CM

## 2016-12-10 DIAGNOSIS — O34219 Maternal care for unspecified type scar from previous cesarean delivery: Secondary | ICD-10-CM | POA: Diagnosis present

## 2016-12-10 DIAGNOSIS — Z8482 Family history of sudden infant death syndrome: Secondary | ICD-10-CM

## 2016-12-10 DIAGNOSIS — F17210 Nicotine dependence, cigarettes, uncomplicated: Secondary | ICD-10-CM | POA: Diagnosis not present

## 2016-12-10 DIAGNOSIS — O99334 Smoking (tobacco) complicating childbirth: Secondary | ICD-10-CM | POA: Diagnosis present

## 2016-12-10 HISTORY — DX: Other specified health status: Z78.9

## 2016-12-10 LAB — CHLAMYDIA/NGC RT PCR (ARMC ONLY)
Chlamydia Tr: NOT DETECTED
N gonorrhoeae: NOT DETECTED

## 2016-12-10 LAB — URINE DRUG SCREEN, QUALITATIVE (ARMC ONLY)
Amphetamines, Ur Screen: NOT DETECTED
BENZODIAZEPINE, UR SCRN: NOT DETECTED
Barbiturates, Ur Screen: NOT DETECTED
Cannabinoid 50 Ng, Ur ~~LOC~~: NOT DETECTED
Cocaine Metabolite,Ur ~~LOC~~: NOT DETECTED
MDMA (ECSTASY) UR SCREEN: NOT DETECTED
Methadone Scn, Ur: NOT DETECTED
Opiate, Ur Screen: NOT DETECTED
Phencyclidine (PCP) Ur S: NOT DETECTED
Tricyclic, Ur Screen: NOT DETECTED

## 2016-12-10 LAB — ROM PLUS (ARMC ONLY): ROM PLUS: POSITIVE

## 2016-12-10 MED ORDER — BETAMETHASONE SOD PHOS & ACET 6 (3-3) MG/ML IJ SUSP
INTRAMUSCULAR | Status: AC
  Administered 2016-12-10: 12 mg via INTRAMUSCULAR
  Filled 2016-12-10: qty 1

## 2016-12-10 MED ORDER — BETAMETHASONE SOD PHOS & ACET 6 (3-3) MG/ML IJ SUSP
12.0000 mg | INTRAMUSCULAR | Status: DC
  Administered 2016-12-10: 12 mg via INTRAMUSCULAR

## 2016-12-10 MED ORDER — AZITHROMYCIN 250 MG PO TABS
1000.0000 mg | ORAL_TABLET | Freq: Once | ORAL | Status: AC
  Administered 2016-12-10: 1000 mg via ORAL
  Filled 2016-12-10: qty 4

## 2016-12-10 MED ORDER — AZITHROMYCIN 250 MG PO TABS: 1000.0000 mg | ORAL_TABLET | Freq: Every day | ORAL | Status: DC

## 2016-12-10 MED ORDER — AMOXICILLIN 500 MG PO CAPS: 500.0000 mg | ORAL_CAPSULE | Freq: Four times a day (QID) | ORAL | 0 refills | Status: DC

## 2016-12-10 NOTE — Procedures (Signed)
Bedside ultrasound peformed:  Bedside ultrasound:  EFW: 3lb 10oz, 1646g AUA: 6039w4d AFI 9cm (3 pockets only without cord within, objectively and subjectively low)     BPD 74.237mm 30.0  HC 279.613mm 30.4 AC 260.512mm 30.1 FL 60.400mm 31.2  AFI: q1 0, q2 3.428mm (2x2 pocket), q3 20.676mm, q4 32.682mm   ----- Ranae Plumberhelsea Zeferino Mounts, MD Attending Obstetrician and Gynecologist [facility], Department of OB/GYN [facility]

## 2016-12-10 NOTE — OB Triage Note (Addendum)
Patient comes to OBS1 for complaint of leaking of fluid that she describes as clear without odor. She denies pain or contractions.  Patient states that she only goes to doctor if she's sick. No prenatal care. Last physician visit was when she had an ultrasound.  Patient was seen by Dr.Ward this visit.  Patient refused to stay for treatment for ruptured membranes.  She had an bedside US by Dr. Elesa MassedWard.  Urine and microbiology culture sent.  Patient left before lab could come up to draw blood. PO Zithromax given.   IM Betamethazone 12mg  given.  Patient was encouraged to come back for the second dose tomorrow if she did leave AMA.

## 2016-12-10 NOTE — Discharge Planning (Signed)
Patient has elected to leave Against medical advice with the diagnosis of: - high risk pregnancy in 3rd trimester - history of cesarean delivery, currently pregnant - history of preterm delivery - insufficient prenatal care - PRETERM PREMATURE RUPTURE OF MEMBRANES AT 4231 WEEKS  She is dated by LMP, consistent with 19wk ultrasound with EDC of 02/08/17  This contract demonstrates she understands the risks she is taking by leaving with her current diagnosis of PPROM:  1. Fetal death 2. Fetal infection 3. Maternal infection 4. ------------------------------------Patient had agreed to sign a paper with me that listed the risks; as I was creating this document she left the hospital without notice.  These topics were discussed at length, with RN Remus BlakeJulie Braddy in room as witness.  ----- Ranae Plumberhelsea Fumi Guadron, MD Attending Obstetrician and Gynecologist [facility], Department of OB/GYN [facility]

## 2016-12-10 NOTE — Discharge Instructions (Signed)
Patient was informed of the risks involved in leaving the hospital without proper treatment to prevent infection related to ruptured membranes. She verbalized disagreement with staying inpatient or longer for observation.  She did receive her first dose of IM betamethazone and was encouraged to come back in 24 hours for the second dose.  She was to continue antibiotics but did not stay for prescription.  She did receive 1000 mg azithromycin PO once. No discharge or AMA papers were signed due to patient leaving without notice.

## 2016-12-10 NOTE — Discharge Summary (Addendum)
Suzanne Rhodes is a 36 y.o. female. [id number] at 3163w3d with LMP of 05/04/16 and limited 19wk ultrasound consistent with dates, with EDC of 02/08/17.  Prenatal care site: No prenatal care  POOR HISTORIAN  Chief complaint: leaking of clear fluid  Location: vagina Onset/timing: this morning Duration: just today Quality: thin, clear, no odor Severity: mild Aggravating or alleviating conditions: n/a Associated signs/symptoms: no CTX, no VB.+ LOF,  Active fetal movement. No fever, chills, trauma, denies drug use, recent infection. + hx chlamydia, no recent illnesses.  Context: Suzanne Rhodes presents today with complaints of leaking clear fluid since this morning.  She was sexually active last night.  She has history of PPROM at 32 weeks with prolapsed cord and emergency cesarean in 05/2014 by Dr. Feliberto GottronSchermerhorn.  She was positive for cocaine at that time, but denies drug use today.   S: Resting comfortably.   PMH: denies PSH: Low transverse cesarean delivery PGyn: no cervical procedures, no STI, no abnormal paps, can't remember if she has ever had a pap.  +chlamydia in last pregnancy POB: 4 EABs, 4 preterm deliveries -  Spontaneous preterm labor  With first three deliveries: "6mos, 6mos 7mos" and one full term SVD (4th delivery), and last pregnancy was the emergency cesarean at 32wks with PPROM. 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 Not employed, FOB is Suzanne Rhodes, involved.    Allergies: NKDA Family hx:  Denies any heart, lung, vascular, or end organ diseases, no cancers.   Maternal Medical History:   Past Medical History:  Diagnosis Date  . Medical history non-contributory     Past Surgical History:   Procedure Laterality Date  . CESAREAN SECTION      No Known Allergies  Prior to Admission medications   Medication Sig Start Date End Date Taking? Authorizing Provider  diazepam (VALIUM) 5 MG tablet Take 1 tablet (5 mg total) by mouth every 8 (eight) hours as needed for muscle spasms. 02/23/15   Emily FilbertWilliams, Jonathan E, MD  ibuprofen (ADVIL,MOTRIN) 800 MG tablet Take 1 tablet (800 mg total) by mouth every 8 (eight) hours as needed. 02/23/15   Emily FilbertWilliams, Jonathan E, MD     Social History: She  reports that she has been smoking Cigarettes.  She has been smoking about 0.50 packs per day. She has never used smokeless tobacco. She reports that she does not drink alcohol or use drugs.  Family History: denies HTN, CAD, DM in her family,  no history of gyn cancers  Review of Systems: A full review of systems was performed and negative except as noted in the HPI.     O: 98.4, 109/75, 86,    Constitutional: NAD, AAOx3  HE/ENT: extraocular movements grossly intact, moist mucous membranes CV: RRR PULM: nl respiratory effort, CTABL     Abd: gravid, non-tender, non-distended, soft      Ext: Non-tender, Nonedmeatous   Psych: mood appropriate, speech normal Pelvic:   Speculum exam performed:  Cervix is long and thick, posterior. External os is 1cm.    +clear fluid pooling, +nitrizine, no ferning on slide.  NST:  Baseline: 150 Variability: minimal Accelerations absent Decelerations absent Time 20mins  Bedside ultrasound:  EFW: 3lb 10oz, 1646g AUA:  [redacted]w[redacted]d AFI 9cm (3 pockets only without cord within, objectively and subjectively low)  BPP not performed due to patient request to leave hospital.    A/P: 36 y.o. [id number] with PPROM at 31.3 weeks  Labor: not present.   IUP: category 2 tracing, uncertain fetal status  Non-Reactive NST   PPROM:   Reviewed plan with patient of inpatient admission, iv and po antibiotics, BMZ, ultrasound, and neonatology consult.   Daily NSTs and weekly  ultrasounds.    She declined everything but a 1 time dose of azithromycin and 1 time dose of BMZ,    She agreed to sign a contract with me that described the risks of delaying or declining care for PPROM, and as I was creating the contract she left.  She did not sign out AMA she just left.  I did NOT discharge her. She also agreed to have labs drawn; it was the phlebotomist that found her room vacant.    She was explained in no uncertain terms that the risks to her fetus included death, severe infection, cognitive delay, respiratory difficulties, NICU admission for potentially months, cord accident, delivery remote from hospital, uterine rupture with percentages for each subsequent section, inability to induce with prostaglandins, maternal infection.    ----- Ranae Plumber, MD Attending Obstetrician and Gynecologist [facility], Department of OB/GYN [facility]  Time spent face to face with patient:  90 minutes Additional time spent working on her chart: 30 minutes Total time: 120 minutes.

## 2016-12-10 NOTE — Progress Notes (Signed)
Patient left floor to go to her car against medical advice.  She was instructed that she needs to be monitored at this time and that her and her baby's wellbeing may be compromised. Patient insisted and left the floor.

## 2016-12-11 LAB — URINE CULTURE

## 2016-12-13 ENCOUNTER — Inpatient Hospital Stay: Payer: Medicaid Other

## 2016-12-13 ENCOUNTER — Inpatient Hospital Stay
Admission: EM | Admit: 2016-12-13 | Discharge: 2016-12-13 | Discharge status: Against Medical Advice | Payer: Medicaid Other | Attending: Obstetrics and Gynecology | Admitting: Obstetrics and Gynecology

## 2016-12-13 DIAGNOSIS — Z5321 Procedure and treatment not carried out due to patient leaving prior to being seen by health care provider: Secondary | ICD-10-CM | POA: Insufficient documentation

## 2016-12-13 DIAGNOSIS — N898 Other specified noninflammatory disorders of vagina: Secondary | ICD-10-CM | POA: Diagnosis present

## 2016-12-13 DIAGNOSIS — O4290 Premature rupture of membranes, unspecified as to length of time between rupture and onset of labor, unspecified weeks of gestation: Secondary | ICD-10-CM

## 2016-12-13 DIAGNOSIS — O42913 Preterm premature rupture of membranes, unspecified as to length of time between rupture and onset of labor, third trimester: Secondary | ICD-10-CM | POA: Diagnosis not present

## 2016-12-13 DIAGNOSIS — O0933 Supervision of pregnancy with insufficient antenatal care, third trimester: Secondary | ICD-10-CM | POA: Diagnosis not present

## 2016-12-13 DIAGNOSIS — Z3A31 31 weeks gestation of pregnancy: Secondary | ICD-10-CM | POA: Diagnosis not present

## 2016-12-13 DIAGNOSIS — O0940 Supervision of pregnancy with grand multiparity, unspecified trimester: Secondary | ICD-10-CM

## 2016-12-13 LAB — CBC
HCT: 33.1 % — ABNORMAL LOW (ref 35.0–47.0)
Hemoglobin: 11.2 g/dL — ABNORMAL LOW (ref 12.0–16.0)
MCH: 29.4 pg (ref 26.0–34.0)
MCHC: 33.8 g/dL (ref 32.0–36.0)
MCV: 86.9 fL (ref 80.0–100.0)
PLATELETS: 250 10*3/uL (ref 150–440)
RBC: 3.81 MIL/uL (ref 3.80–5.20)
RDW: 14.1 % (ref 11.5–14.5)
WBC: 10.2 10*3/uL (ref 3.6–11.0)

## 2016-12-13 LAB — DIFFERENTIAL
BASOS ABS: 0 10*3/uL (ref 0–0.1)
BASOS PCT: 0 %
Eosinophils Absolute: 0.1 10*3/uL (ref 0–0.7)
Eosinophils Relative: 1 %
LYMPHS PCT: 15 %
Lymphs Abs: 1.5 10*3/uL (ref 1.0–3.6)
Monocytes Absolute: 0.5 10*3/uL (ref 0.2–0.9)
Monocytes Relative: 5 %
NEUTROS PCT: 79 %
Neutro Abs: 8.1 10*3/uL — ABNORMAL HIGH (ref 1.4–6.5)

## 2016-12-13 LAB — TYPE AND SCREEN
ABO/RH(D): O POS
ANTIBODY SCREEN: NEGATIVE

## 2016-12-13 LAB — RAPID HIV SCREEN (HIV 1/2 AB+AG)
HIV 1/2 ANTIBODIES: NONREACTIVE
HIV-1 P24 Antigen - HIV24: NONREACTIVE

## 2016-12-13 MED ORDER — CALCIUM CARBONATE ANTACID 500 MG PO CHEW: 2.0000 | CHEWABLE_TABLET | ORAL | Status: DC | PRN

## 2016-12-13 MED ORDER — BETAMETHASONE SOD PHOS & ACET 6 (3-3) MG/ML IJ SUSP: 12.0000 mg | INTRAMUSCULAR | Status: DC

## 2016-12-13 MED ORDER — ACETAMINOPHEN 325 MG PO TABS
650.0000 mg | ORAL_TABLET | ORAL | Status: DC | PRN
Start: 2016-12-13 — End: 2016-12-13

## 2016-12-13 MED ORDER — ERYTHROMYCIN BASE 250 MG PO TBEC
250.0000 mg | DELAYED_RELEASE_TABLET | Freq: Four times a day (QID) | ORAL | Status: DC
  Filled 2016-12-13: qty 1

## 2016-12-13 MED ORDER — AMOXICILLIN 250 MG PO CAPS: 250.0000 mg | ORAL_CAPSULE | Freq: Three times a day (TID) | ORAL | Status: DC

## 2016-12-13 MED ORDER — AZITHROMYCIN 250 MG PO TABS: 1000.0000 mg | ORAL_TABLET | Freq: Once | ORAL | Status: DC

## 2016-12-13 NOTE — Discharge Instructions (Signed)
Because breaking your water early is risky for infection and early delivery, as well as having the baby at home too early and the baby having infection, we really recommend you stay in the hospital so we can watch you both many times a day. I do NOT recommend that you go home.  However, I understand that you feel you need to be at home. I am recommending the below: - pelvic rest until delivery - bedrest except for bathroom,  - Recording your temp and pulse every six hours - Daily charting of fetal movements, with prompt return to triage if decreased - twice weekly to our office for monitoring the baby heartbeat and checking your blood count - weekly ultrasound and visualization of the cervix - 7 days of antibiotics: amoxicillin 500mg  three times a day and erthyromycin 250 mg every 6 hours.          Premature Rupture and Preterm Premature Rupture of Membranes Rupture of membranes is when the membranes (amniotic sac) that hold your baby break open. This is commonly referred to as your "water breaking." If your water breaks before labor starts (prematurely), it is called premature rupture of membranes (PROM). If PROM occurs before 37 weeks of pregnancy, it is called preterm premature rupture of membranes (PPROM). Because the amniotic sac keeps infection out and performs other important functions, having the amniotic sac rupture before 37 weeks of pregnancy can lead to serious problems. It requires immediate attention from a health care provider. What are the causes? When PROM occurs at 37 weeks of pregnancy or later, it is usually caused by natural weakening of the membranes and friction caused by contractions. PPROM is usually caused by infection. In many cases, the cause is not known. What increases the risk of PPROM? The following factors may make you more likely to have PPROM:  Infection.  Having had PPROM in a previous pregnancy.  Short cervical length.  Bleeding during the  second or third trimester.  Low BMI, which is an estimate of body fat.  Smoking.  Using drugs.  Low socioeconomic status.  What problems can be caused by PROM and PPROM? This condition creates health dangers for the mother and the baby. These include:  Delivering a premature baby.  Getting a serious infection of the placental tissues (chorioamnionitis).  Early detachment of the placenta from the uterus (placental abruption).  Compression of the umbilical cord.  Developing a serious infection after delivery.  What are the signs of PROM and PPROM? Signs of this condition include:  A sudden gush or slow leaking of fluid from the vagina.  Constant wet underwear.  Sometimes, women mistake the leaking or wetness for urine, especially if the leak is slow and not a gush of fluid. If there is constant leaking or if your underwear continues to get wet, your membranes have likely ruptured. What should I do if I think my membranes have ruptured?  Call your health care provider right away.  You will need to go to the hospital immediately to be checked by a health care provider. What happens if I am diagnosed with PROM or PPROM? Once you arrive at the hospital, you will have tests done. A cervical exam will be done using a lubricated instrument (speculum) to check whether the cervix has softened or started to open (dilate).  If you are diagnosed with PROM, your labor may be started for you (you may be induced) within 24 hours if you are not having contractions.  If you are diagnosed with PPROM and you are not having contractions, you may be induced depending on your trimester.  If you have PPROM:  You and your baby will be monitored closely for signs of infection or other complications.  You may be given: ? An antibiotic medicine to lower the chances of developing an infection. ? A steroid medicine to help mature the baby's lungs more quickly. ? A medicine to help prevent cerebral  palsy in your baby. ? A medicine to stop preterm labor.  You may be ordered to be on bed rest at home or in the hospital.  You may be induced if complications occur for you or the baby.  Your treatment will depend on many factors, such as how many weeks you have been pregnant (how far along you are), the development of the baby, and other complications that may occur. This information is not intended to replace advice given to you by your health care provider. Make sure you discuss any questions you have with your health care provider. Document Released: 06/05/2005 Document Revised: 02/02/2016 Document Reviewed: 01/10/2016 Elsevier Interactive Patient Education  [employer].

## 2016-12-13 NOTE — Progress Notes (Signed)
Took patient to ultrasound. Received call from them that the pts. Significant other got upset that he wasn't allowed in the room for the ultrasound and that they had walked out.  Patient and significant other seen leaving thru medical mall entrance. Dr. Dalbert GarnetBeasley notified.

## 2016-12-13 NOTE — OB Triage Provider Note (Signed)
TRIAGE VISIT with NST  CC: Leaking fluid in pregnancy   Suzanne Rhodes is a 36 y.o. [id number]. She is at 5961w6d gestation by LMP and 19wk ultrasound.  S: No contractions, still leaking clear fluid, good fetal movement, no pain or fever. "vagina feels weird". Occasional cramping and no pressure, but feels "flutters" in the vagina sometimes.  - No prenatal care this pregnancy  O:  Temp 98.3 F (36.8 C) (Oral)   Resp 18   Ht 5\' 2"  (1.575 m)   Wt 77.1 kg (170 lb)   LMP 05/04/2016   BMI 31.09 kg/m  No results found for this or any previous visit (from the past 48 hour(s)).   Gen: NAD, AAOx3      Abd: FNTTP      Ext: Non-tender, Nonedmeatous    FHT: 145, mod var, +accels, no decels TOCO: quiet SVE: Dilation: Closed Exam by:: Amaurie Schreckengost  A/P:  36 y.o. [id number] at 5961w6d with confirmed PPROM at 31 wks, now 31+6wks.   She adamantly is unwilling to stay in house until 34 wks  Risks, including maternal and fetal infection, cord prolapse (which prompted a stat pLTCS with last delivery), uterine rupture at c/s scar, precipitous labor and delivery of a preterm baby at home were discussed.  Second BMZ shot today  No active labor. Reassuring fetal testing  Growth ultrasound ordered, but patient refused as FOB was not allowed to be present  Prenatal labs drawn today and pending  GBS pending  I am not willing to discharge her, but I am going to do the best I can to minimize risk to her and the baby girl. Her FOB is present and in agreement. She agrees to:  - pelvic rest until delivery - bedrest except for bathroom,  - Recording her temp and pulse every six hours - Daily charting of fetal movements, with prompt return to triage if decreased - twice weekly NSTs and CBC - weekly ultrasound and visualization of the cervix - 7 days of antibiotics: amoxicillin 500mg  TID and erthyromycin 250 mg every 6 hours.   As I was writing this note, I received a call that she and the FOB had walked out  of the ultrasound suite and left AMA. I did not discharge her, but I did review the above recommendations. She did not get a second dose of BMZ, nor any antibiotics here. She did not complete her ultrasound.

## 2016-12-14 LAB — CULTURE, BETA STREP (GROUP B ONLY)

## 2016-12-14 LAB — RPR: RPR: NONREACTIVE

## 2016-12-14 LAB — RUBELLA SCREEN: Rubella: 2.89 index (ref >0.99)

## 2016-12-14 LAB — HEPATITIS B SURFACE ANTIGEN: HEP B S AG: NEGATIVE

## 2016-12-16 ENCOUNTER — Inpatient Hospital Stay
Admission: EM | Admit: 2016-12-16 | Discharge: 2016-12-16 | DRG: 775 | Disposition: A | Discharge status: Home / Self Care | Payer: Medicaid Other | Attending: Obstetrics & Gynecology | Admitting: Obstetrics & Gynecology

## 2016-12-16 DIAGNOSIS — Z3A32 32 weeks gestation of pregnancy: Secondary | ICD-10-CM | POA: Diagnosis not present

## 2016-12-16 DIAGNOSIS — O34219 Maternal care for unspecified type scar from previous cesarean delivery: Secondary | ICD-10-CM | POA: Diagnosis present

## 2016-12-16 DIAGNOSIS — F17210 Nicotine dependence, cigarettes, uncomplicated: Secondary | ICD-10-CM | POA: Diagnosis present

## 2016-12-16 DIAGNOSIS — O42913 Preterm premature rupture of membranes, unspecified as to length of time between rupture and onset of labor, third trimester: Principal | ICD-10-CM | POA: Diagnosis present

## 2016-12-16 DIAGNOSIS — O09899 Supervision of other high risk pregnancies, unspecified trimester: Secondary | ICD-10-CM

## 2016-12-16 DIAGNOSIS — O34211 Maternal care for low transverse scar from previous cesarean delivery: Secondary | ICD-10-CM | POA: Diagnosis present

## 2016-12-16 DIAGNOSIS — O0993 Supervision of high risk pregnancy, unspecified, third trimester: Secondary | ICD-10-CM

## 2016-12-16 DIAGNOSIS — O99334 Smoking (tobacco) complicating childbirth: Secondary | ICD-10-CM | POA: Diagnosis present

## 2016-12-16 DIAGNOSIS — O0930 Supervision of pregnancy with insufficient antenatal care, unspecified trimester: Secondary | ICD-10-CM

## 2016-12-16 DIAGNOSIS — O42919 Preterm premature rupture of membranes, unspecified as to length of time between rupture and onset of labor, unspecified trimester: Secondary | ICD-10-CM | POA: Diagnosis present

## 2016-12-16 DIAGNOSIS — Z8482 Family history of sudden infant death syndrome: Secondary | ICD-10-CM

## 2016-12-16 DIAGNOSIS — O09219 Supervision of pregnancy with history of pre-term labor, unspecified trimester: Secondary | ICD-10-CM

## 2016-12-16 LAB — COMPREHENSIVE METABOLIC PANEL
ALK PHOS: 110 U/L (ref 38–126)
ALT: 9 U/L — ABNORMAL LOW (ref 14–54)
ANION GAP: 8 (ref 5–15)
AST: 13 U/L — ABNORMAL LOW (ref 15–41)
Albumin: 2.8 g/dL — ABNORMAL LOW (ref 3.5–5.0)
BUN: 5 mg/dL — ABNORMAL LOW (ref 6–20)
CALCIUM: 8.3 mg/dL — AB (ref 8.9–10.3)
CO2: 23 mmol/L (ref 22–32)
Chloride: 105 mmol/L (ref 101–111)
Creatinine, Ser: 0.31 mg/dL — ABNORMAL LOW (ref 0.44–1.00)
GFR calc non Af Amer: >60 mL/min (ref >60)
Glucose, Bld: 87 mg/dL (ref 65–99)
Potassium: 3.1 mmol/L — ABNORMAL LOW (ref 3.5–5.1)
SODIUM: 136 mmol/L (ref 135–145)
TOTAL PROTEIN: 6.5 g/dL (ref 6.5–8.1)
Total Bilirubin: 0.6 mg/dL (ref 0.3–1.2)

## 2016-12-16 LAB — URINE DRUG SCREEN, QUALITATIVE (ARMC ONLY)
Amphetamines, Ur Screen: NOT DETECTED
Barbiturates, Ur Screen: NOT DETECTED
Benzodiazepine, Ur Scrn: NOT DETECTED
CANNABINOID 50 NG, UR ~~LOC~~: NOT DETECTED
Cocaine Metabolite,Ur ~~LOC~~: NOT DETECTED
MDMA (ECSTASY) UR SCREEN: NOT DETECTED
Methadone Scn, Ur: NOT DETECTED
Opiate, Ur Screen: NOT DETECTED
PHENCYCLIDINE (PCP) UR S: NOT DETECTED
Tricyclic, Ur Screen: NOT DETECTED

## 2016-12-16 LAB — URINALYSIS, ROUTINE W REFLEX MICROSCOPIC
BILIRUBIN URINE: NEGATIVE
GLUCOSE, UA: NEGATIVE mg/dL
KETONES UR: NEGATIVE mg/dL
NITRITE: NEGATIVE
PROTEIN: 30 mg/dL — AB
Specific Gravity, Urine: 1.013 (ref 1.005–1.030)
pH: 8 (ref 5.0–8.0)

## 2016-12-16 LAB — CBC
HEMATOCRIT: 33.3 % — AB (ref 35.0–47.0)
HEMOGLOBIN: 11.4 g/dL — AB (ref 12.0–16.0)
MCH: 30.1 pg (ref 26.0–34.0)
MCHC: 34.3 g/dL (ref 32.0–36.0)
MCV: 87.9 fL (ref 80.0–100.0)
Platelets: 239 10*3/uL (ref 150–440)
RBC: 3.79 MIL/uL — AB (ref 3.80–5.20)
RDW: 13.8 % (ref 11.5–14.5)
WBC: 11.8 10*3/uL — ABNORMAL HIGH (ref 3.6–11.0)

## 2016-12-16 LAB — CULTURE, BETA STREP (GROUP B ONLY)

## 2016-12-16 MED ORDER — ONDANSETRON HCL 4 MG PO TABS: 4.0000 mg | ORAL_TABLET | ORAL | Status: DC | PRN

## 2016-12-16 MED ORDER — DIPHENHYDRAMINE HCL 25 MG PO CAPS: 25.0000 mg | ORAL_CAPSULE | Freq: Four times a day (QID) | ORAL | Status: DC | PRN

## 2016-12-16 MED ORDER — AMOXICILLIN 500 MG PO CAPS: 500.0000 mg | ORAL_CAPSULE | Freq: Three times a day (TID) | ORAL | Status: DC

## 2016-12-16 MED ORDER — OXYTOCIN 10 UNIT/ML IJ SOLN
INTRAMUSCULAR | Status: AC
  Filled 2016-12-16: qty 2

## 2016-12-16 MED ORDER — DOCUSATE SODIUM 100 MG PO CAPS: 100.0000 mg | ORAL_CAPSULE | Freq: Two times a day (BID) | ORAL | Status: DC

## 2016-12-16 MED ORDER — ONDANSETRON HCL 4 MG/2ML IJ SOLN: 4.0000 mg | INTRAMUSCULAR | Status: DC | PRN

## 2016-12-16 MED ORDER — NICOTINE POLACRILEX 2 MG MT GUM
2.0000 mg | CHEWING_GUM | OROMUCOSAL | Status: DC | PRN
  Filled 2016-12-16: qty 1

## 2016-12-16 MED ORDER — ACETAMINOPHEN 500 MG PO TABS: 1000.0000 mg | ORAL_TABLET | Freq: Four times a day (QID) | ORAL | Status: DC | PRN

## 2016-12-16 MED ORDER — WITCH HAZEL-GLYCERIN EX PADS: 1.0000 "application " | MEDICATED_PAD | CUTANEOUS | Status: DC | PRN

## 2016-12-16 MED ORDER — IBUPROFEN 600 MG PO TABS: 600.0000 mg | ORAL_TABLET | Freq: Four times a day (QID) | ORAL | Status: DC

## 2016-12-16 MED ORDER — BETAMETHASONE SOD PHOS & ACET 6 (3-3) MG/ML IJ SUSP
INTRAMUSCULAR | Status: AC
  Administered 2016-12-16: 12 mg via INTRAMUSCULAR
  Filled 2016-12-16: qty 1

## 2016-12-16 MED ORDER — LIDOCAINE HCL (PF) 1 % IJ SOLN
INTRAMUSCULAR | Status: AC
  Filled 2016-12-16: qty 30

## 2016-12-16 MED ORDER — PRENATAL MULTIVITAMIN CH: 1.0000 | ORAL_TABLET | Freq: Every day | ORAL | Status: DC

## 2016-12-16 MED ORDER — OXYTOCIN 40 UNITS IN LACTATED RINGERS INFUSION - SIMPLE MED
INTRAVENOUS | Status: AC
  Filled 2016-12-16: qty 1000

## 2016-12-16 MED ORDER — SODIUM CHLORIDE 0.9 % IV SOLN
2.0000 g | Freq: Four times a day (QID) | INTRAVENOUS | Status: DC
  Filled 2016-12-16 (×3): qty 2000

## 2016-12-16 MED ORDER — MISOPROSTOL 200 MCG PO TABS
ORAL_TABLET | ORAL | Status: AC
  Filled 2016-12-16: qty 3

## 2016-12-16 MED ORDER — COCONUT OIL OIL: 1.0000 "application " | TOPICAL_OIL | Status: DC | PRN

## 2016-12-16 MED ORDER — BENZOCAINE-MENTHOL 20-0.5 % EX AERO: 1.0000 "application " | INHALATION_SPRAY | CUTANEOUS | Status: DC | PRN

## 2016-12-16 MED ORDER — DIBUCAINE 1 % RE OINT: 1.0000 "application " | TOPICAL_OINTMENT | RECTAL | Status: DC | PRN

## 2016-12-16 MED ORDER — BETAMETHASONE SOD PHOS & ACET 6 (3-3) MG/ML IJ SUSP
12.0000 mg | Freq: Once | INTRAMUSCULAR | Status: AC
  Administered 2016-12-16: 12 mg via INTRAMUSCULAR

## 2016-12-16 MED ORDER — AMMONIA AROMATIC IN INHA
RESPIRATORY_TRACT | Status: AC
  Filled 2016-12-16: qty 10

## 2016-12-16 MED ORDER — MEDROXYPROGESTERONE ACETATE 150 MG/ML IM SUSP
150.0000 mg | INTRAMUSCULAR | Status: AC | PRN
  Administered 2016-12-16: 150 mg via INTRAMUSCULAR
  Filled 2016-12-16: qty 1

## 2016-12-16 MED ORDER — SIMETHICONE 80 MG PO CHEW: 80.0000 mg | CHEWABLE_TABLET | ORAL | Status: DC | PRN

## 2016-12-16 MED ORDER — ACETAMINOPHEN 325 MG PO TABS: 650.0000 mg | ORAL_TABLET | ORAL | Status: DC | PRN

## 2016-12-16 NOTE — Progress Notes (Signed)
Patient has just returned to mother baby unit. [facility]

## 2016-12-16 NOTE — Consult Note (Signed)
Asked by Dr.Ward to provide prenatal consultation for 36 y.o. G10 P1-4-4-4 who is now 32.[redacted] weeks EGA, with pregnancy complicated by lack of prenatal care and general non-compliance with health care.  She was seen and noted to have PPROM on 6/24 and was given amoxicillin and betamethasone at that time.  She presented today in labor and is planning a VBAC.  Discussed with patient and FOB usual expectations for preterm infant at [redacted] weeks gestation, including possible needs for DR resuscitation, respiratory support, and IV access  Discussed advantages of feeding with mother's milk but she declines to pump.  Will consider use of donor milk. Patient and FOB were attentive, had few questions, expressed appreciation for my input.  Thank you for consulting Neonatology.  Total time 25 minutes  JWimmer, MD

## 2016-12-16 NOTE — H&P (Signed)
OB History & Physical   History of Present Illness:  Chief Complaint: low abdominal pain  HPI:  Suzanne Rhodes is a 36 y.o. [id number] female at 2421w2d dated by LMP c/w 19wk US with Estimated Date of Delivery: 02/08/17, and PPROM since 12/10/16 (7 days) and refusal of care/interventions.  Briefly, she presented on 6/24 and was diagnosed with PPROM, was given amoxicillin 1000mg  x1 PO and 1 IM dose of BMZ.  She presented again on 6/27, and again left without intervention.  She presents again L&D, with complaints of intermittent low abdominal pain.  Does not describe them well.  They do come and go, and it is sometimes pressure sometimes crampy, and "just pain, I don't know" and she has more "goo" coming out of her vagina.  No bleeding or foul-smelling discharge.  +FM, no CTX, + LOF, no VB.  No fever, chills, or fundal tenderness.  Pregnancy Issues: 1. History of LTCS for PPROM and cord prolapse @ 32wks 2. PPROM this pregnancy at 31wks, refusal of care 3. No prenatal care 4. Smoker 5. History of 3 other preterm deliveries   PMH: denies PSH: Low transverse cesarean delivery PGyn: no cervical procedures, no STI, no abnormal paps, can't remember if she has ever had a pap.  +chlamydia in last pregnancy POB: 4 EABs, 4 preterm deliveries -  Spontaneous preterm labor  With first three deliveries: "6mos, 6mos 7mos" and one full term SVD (4th delivery), and last pregnancy was the emergency cesarean at 32wks with PPROM. 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 Not employed, FOB is [employer], involved.    Allergies: NKDA Family hx:  Denies any heart, lung, vascular, or end organ diseases, no cancers  Maternal Medical  History:   Past Medical History:  Diagnosis Date  . Medical history non-contributory     Past Surgical History:  Procedure Laterality Date  . CESAREAN SECTION      No Known Allergies  Prior to Admission medications   Medication Sig Start Date End Date Taking? Authorizing Provider  amoxicillin (AMOXIL) 500 MG capsule Take 1 capsule (500 mg total) by mouth every 6 (six) hours. 12/10/16 12/17/16  Kaliel Bolds, Elenora Fenderhelsea C, MD     Prenatal care site: none   Social History: She  reports that she has been smoking Cigarettes.  She has been smoking about 0.50 packs per day. She has never used smokeless tobacco. She reports that she does not drink alcohol or use drugs.  Family History: family history is not on file.   Review of Systems: A full review of systems was performed and negative except as noted in the HPI.     Physical Exam:  Vital Signs: BP (!) 121/52   Pulse 83   Temp 98.4 F (36.9 C)   LMP 05/04/2016  General: no acute distress.  HEENT: normocephalic, atraumatic Heart: regular rate & rhythm.  No murmurs/rubs/gallops Lungs: clear to auscultation bilaterally, normal respiratory effort Abdomen: soft, gravid, non-tender;  EFW: 4lb Pelvic:   External: Normal external female genitalia  Cervix: not checked   Extremities: non-tender, symmetric, no edema bilaterally.  DTRs: 2+ Neurologic: Alert & oriented x 3.      Pertinent Results:  Prenatal  Labs: Blood type/Rh O+  Antibody screen neg  Rubella Immune  Varicella Not collected  RPR NR  HBsAg Neg  HIV NR  GC neg  Chlamydia neg  Genetic screening Not done  1 hour GTT Not done  3 hour GTT   GBS Negative on 12/10/16   FHT: 135 mod no accels no decels TOCO: occasional ctx (not palpable to patient) SVE:  deferred    Cephalic by leopolds   Assessment:  Suzanne Rhodes is a 36 y.o. [id number] female at [redacted]w[redacted]d with PPROM.   Plan:  1. Admit to antepartum 2. CBC, CMP, Varicella, T&S 3. GBS neg   4. Clear liquid  diet 5. Continuous efm/toco 6. Ampicillin IV, will await results of WBC and glucose prior to admin second dose of BMZ.  ----- Ranae Plumber, MD Attending Obstetrician and Gynecologist [facility], Department of OB/GYN [facility]

## 2016-12-16 NOTE — Progress Notes (Signed)
Patient adamant about being able to come and go to the hospital.  Explained to patient she is free to leave, we can't keep her here, but she cannot be admitted and leave the premises for safety sake.    She says she is going to leave.  I offered her depoprovera for the interim, in case she bails on her tubal appointment (my words to her), and she agreed to have the shot before she leaves.  I reviewed signs and symptoms of hemorrhage, infection, and PE.  She says she will return regularly to visit Diane in the NICU.  ----- Suzanne Plumberhelsea Breeana Sawtelle, MD Attending Obstetrician and Gynecologist [facility], Department of OB/GYN [facility]

## 2016-12-16 NOTE — Progress Notes (Signed)
Discharged home via w/c. 

## 2016-12-16 NOTE — Progress Notes (Signed)
Went in to room to start patients IV and draw labs, pt refused for me to do so until she is able to smoke.  Made Dr ward aware of situation, patient refused wheelchair and will be walking outside.

## 2016-12-16 NOTE — Progress Notes (Signed)
Attempted to call patient on number provided phone rings with no option to leave a voicemail.  Dr ward is aware and would like us to discharge patient out of system ama.

## 2016-12-16 NOTE — OB Triage Note (Signed)
Patient presents via ems with lower abdominal pain that started around 5 a. She is not able to tell me how often these pains come and go and wether there is a pattern. There is also some "gooey" stuff coming out, she cannot describe color or odor.

## 2016-12-16 NOTE — Progress Notes (Signed)
Notified that patient was tearful and grimacing with pain;  Contractions now picking up on monitor, q2-105min Ultrasound confirmed cephalic position, head low in pelvis  SVE: 6/90/-1  Will move to labor floor, administer second dose of BMZ and Neonatology consult   VBAC protocol in place -  I stay in house until delivery Anesthesia and team in house until delivery OR available until delivery.   ----- Ranae Plumberhelsea Ward, MD Attending Obstetrician and Gynecologist [facility], Department of OB/GYN [facility]

## 2016-12-16 NOTE — Progress Notes (Addendum)
Patient in shower after request to let nurse complete fundal checks, will only allow checks intermittent  Against nursing advice patient is also refusing all blood pressures.she was educated about risks and patient  verbalizes understanding.

## 2016-12-16 NOTE — Discharge Instructions (Signed)
Discharge instructions:   Call office if you have any of the following: headache, visual changes, fever >101.0 F, chills, breast concerns, excessive vaginal bleeding, incision drainage or problems, leg pain or redness, depression or any other concerns.   Activity: Do not lift > 10 lbs for 6 weeks.  No intercourse or tampons for 6 weeks.  No driving for 1-2 weeks.   Call your doctor for increased pain or vaginal bleeding, temperature above 101.0, depression, or concerns.  No strenuous activity or heavy lifting for 6 weeks.  No intercourse, tampons, douching, or enemas for 6 weeks.  No tub baths-showers only.  No driving for 2 weeks or while taking pain medications.  Continue prenatal vitamin and iron.  Increase calories and fluids while breastfeeding.  You may have a slight fever when your milk comes in, but it should go away on its own.  If it does not, and rises above 101.0 please call the doctor.  You signed consent for a tubal ligation today, you will need to wait a state-mandated 30day waiting period before you can have this procedure done. You will be contacted for an appointment to have this surgery set up.  If you decide not to have the surgery, your depoprovera shot will cover you for 3 months, please consider using a long-acting contraception.

## 2016-12-16 NOTE — Discharge Summary (Signed)
Obstetrical Discharge Summary  Patient Name: Suzanne Rhodes DOB: [birth year] MRN: [id number]  Date of Admission: 12/16/2016 Date of Delivery: 12/16/16 Delivered by: Ranae Plumber, MD Date of Discharge: 12/16/2016 - Against medical advice  Primary OB: none [id number] last menstrual period was 05/04/2016. EDC Estimated Date of Delivery: 02/08/17 Gestational Age at Delivery: [redacted]w[redacted]d   Antepartum complications:  1. History of LTCS for PPROM and cord prolapse @ 32wks 2. PPROM this pregnancy at 31wks, refusal of care 3. No prenatal care 4. Smoker 5. History of 3 other preterm deliveries 6. History of infant death from SIDS  Admitting Diagnosis: PPROM, insufficient prenatal care Secondary Diagnosis: Patient Active Problem List   Diagnosis Date Noted  . Preterm premature rupture of membranes, antepartum 12/16/2016  . Labor and delivery indication for care or intervention 25-Dec-2016  . History of preterm delivery, currently pregnant December 25, 2016  . History of cesarean delivery, currently pregnant 12-25-2016  . History of sudden infant death syndrome in family 25-Dec-2016  . High-risk pregnancy in third trimester 2016/12/25  . Smoking (tobacco) complicating childbirth 2016-12-25  . Insufficient prenatal care 12-25-16    Augmentation: none Complications: None Intrapartum complications/course: Mom presented to L&D at 31+3 weeks with PPROM.  Left AMA.  Returned today with contractions, in labor found to be 6cm. Progressed to complete, second stage: <66min.  delivery of fetal head with restitution to LOT.   Anterior then posterior shoulders delivered without difficulty.  Short cord. Baby placed below level of placenta and delayed cord clamping for 60sec.  Baby was vigorous. During this time we sang excerpts of "Ree Kida and Diane" by Silvio Pate.  Cord was then clamped and cut by FOB Dishawn.  Placenta spontaneously delivered, intact. Sent to pathology.   IM pitocin given for hemorrhage prophylaxis.   Baby was attended to by the NICU team, then brought to NICU for placement.  There, we sang happy birthday to Diane.  Date of Delivery: 12/16/16 Delivered By: Leeroy Bock Korey Arroyo Delivery Type: vaginal birth after cesarean (VBAC) Anesthesia: none Placenta: sponatneous Laceration: none Episiotomy: none Newborn Data: Live born female  Birth Weight: 4 lb 2 oz (1871 g) APGAR: 8, 9  Postpartum Procedures: depo-provera  Post partum course:  N/a patient left AMA after delivery of infant.      Discharge Physical Exam: BP 138/79 (BP Location: Left Arm) Comment: pt just returned to floor from being down stairs  Pulse 87   Temp 98.5 F (36.9 C) (Oral)   Resp 18   Ht 5\' 2"  (1.575 m)   Wt 170 lb (77.1 kg)   LMP 05/04/2016   SpO2 99%   Breastfeeding? Unknown   BMI 31.09 kg/m   Physical exam not performed  Hemoglobin  Date Value Ref Range Status  12/16/2016 11.4 (L) 12.0 - 16.0 g/dL Final   HGB  Date Value Ref Range Status  05/31/2014 9.1 (L) 12.0 - 16.0 g/dL Final   HCT  Date Value Ref Range Status  12/16/2016 33.3 (L) 35.0 - 47.0 % Final  05/30/2014 30.2 (L) 35.0 - 47.0 % Final    Baby Feeding: formula Baby Disposition: NICU   Rh Immune globulin given: n/a Rubella vaccine given: n/a Tdap vaccine given in AP or PP setting: declined Flu vaccine given in AP or PP setting: declined  Contraception: signed tubal papers today, will schedule in 30 days.    Prenatal Labs:  Blood type/Rh O+  Antibody screen neg  Rubella Immune  Varicella Not collected  RPR NR  HBsAg Neg  HIV NR  GC neg  Chlamydia neg  Genetic screening Not done  1 hour GTT Not done  3 hour GTT   GBS Negative on 12/10/16    Social work was consulted STAT to see patient, they did not see her. Will try to have them meet her when she visits the baby in the NICU.  Plan:  Dimple CaseySamantha Hatlestad was discharged to home in good condition. [facility] in 2-4 weeks for preop  visit.  Discharge Medications: Allergies as of 12/16/2016   No Known Allergies     Medication List    STOP taking these medications   amoxicillin 500 MG capsule Commonly known as:  AMOXIL       Follow-up Information    Tonjua Rossetti, Elenora Fenderhelsea C, MD Follow up in 3 week(s).   Specialty:  Obstetrics and Gynecology Why:  to set up tubal surgery Contact information: [address] [facility] [place] [id number] [phone]           Signed: ----- Ranae Plumberhelsea Ferol Laiche, MD Attending Obstetrician and Gynecologist [facility], Department of OB/GYN [facility]

## 2016-12-16 NOTE — Progress Notes (Signed)
Patient was transported to Mother baby unit via wheelchair by this rn at [phone] and placed in room 342 along  With significant other and  personal belongings patient was wearing a blue hospital gown. I left patient in her room and went to give report to oncoming nurse right outside patients room. When oncoming nurse went into check on patient 5 minutes later the hospital gown was placed over the trash can and all personal belongings were gone. We went to nursery to see if patient or signifcant other were there and they were not. Patient is not answering the phone number provided. Shift coordinator  aware patient left AMA and without signing paperwork. AC cheryl notified of situation and [facility] ward MD notified

## 2016-12-16 NOTE — Progress Notes (Addendum)
Dr ward called to bedside, this rn found patient with clothes on and IV ripped out in the floor " I want to smoke a cigarette and im leaving" patient educated again on risks of leaving floor she verbalizes understanding

## 2016-12-16 NOTE — Progress Notes (Signed)
Patient refuses to stay in bed or in room wants to walk, agreed to wireless monitor, she continues to refuse contraction monitoring . Patient was educated about risks of walking and leaving floor, she continues to ask to go smoke, she verbalized understanding of instructions and continues to be non complaint.

## 2016-12-16 NOTE — Progress Notes (Signed)
Received word from postpartum that as soon as the nurse transferred her from birthing suite to the postpartum room, the patient got dressed and left.  She did not notify anyone.  Unsure if she will return.

## 2016-12-16 NOTE — Progress Notes (Signed)
Patient took out her IV and monitors and put street clothes on, saying she was going to smoke, that nothing was happening and lying there wasn't making anything better, that she was in pain  I offered her nicotine gum, and explained that if she delivered her 132 weeker anywhere other than the labor floor she would be risking its life.   She agreed to stay on the floor and walk.  Refused nitrous oxide, refused epidural.

## 2016-12-17 LAB — TYPE AND SCREEN
ABO/RH(D): O POS
Antibody Screen: NEGATIVE

## 2016-12-17 LAB — VARICELLA ZOSTER ANTIBODY, IGG: VARICELLA IGG: 807 {index} (ref >165)

## 2016-12-19 LAB — SURGICAL PATHOLOGY

## 2017-09-18 ENCOUNTER — Emergency Department
Admission: EM | Admit: 2017-09-18 | Discharge: 2017-09-18 | Disposition: A | Discharge status: Home / Self Care | Payer: Self-pay | Attending: Emergency Medicine | Admitting: Emergency Medicine

## 2017-09-18 ENCOUNTER — Encounter: Payer: Self-pay | Admitting: *Deleted

## 2017-09-18 ENCOUNTER — Other Ambulatory Visit: Payer: Self-pay

## 2017-09-18 DIAGNOSIS — F17210 Nicotine dependence, cigarettes, uncomplicated: Secondary | ICD-10-CM | POA: Insufficient documentation

## 2017-09-18 DIAGNOSIS — B9689 Other specified bacterial agents as the cause of diseases classified elsewhere: Secondary | ICD-10-CM | POA: Insufficient documentation

## 2017-09-18 DIAGNOSIS — N760 Acute vaginitis: Secondary | ICD-10-CM | POA: Insufficient documentation

## 2017-09-18 DIAGNOSIS — R300 Dysuria: Secondary | ICD-10-CM

## 2017-09-18 LAB — URINALYSIS, COMPLETE (UACMP) WITH MICROSCOPIC
Bacteria, UA: NONE SEEN
Bilirubin Urine: NEGATIVE
GLUCOSE, UA: NEGATIVE mg/dL
Ketones, ur: NEGATIVE mg/dL
Leukocytes, UA: NEGATIVE
NITRITE: NEGATIVE
PH: 6 (ref 5.0–8.0)
PROTEIN: NEGATIVE mg/dL
Specific Gravity, Urine: 1.023 (ref 1.005–1.030)

## 2017-09-18 LAB — WET PREP, GENITAL
SPERM: NONE SEEN
Trich, Wet Prep: NONE SEEN
Yeast Wet Prep HPF POC: NONE SEEN

## 2017-09-18 LAB — CHLAMYDIA/NGC RT PCR (ARMC ONLY)
CHLAMYDIA TR: NOT DETECTED
N GONORRHOEAE: NOT DETECTED

## 2017-09-18 MED ORDER — METRONIDAZOLE 500 MG PO TABS: 500.0000 mg | ORAL_TABLET | Freq: Two times a day (BID) | ORAL | 0 refills | Status: AC

## 2017-09-18 NOTE — Discharge Instructions (Addendum)
You have tests pending at the time of your decision to discharge. You may call back for the results. Return to the ED as needed.

## 2017-09-18 NOTE — ED Triage Notes (Signed)
Pt to ED reporting feeling as though she is unable to empty her bladder completely for the past month. Pt denies dysuria and denies fevers.No flank pains. Pt denies vaginal discharge but reports she has been "dryer than normal"  Pt denies having been exposed to STDs.

## 2017-09-18 NOTE — ED Provider Notes (Signed)
[facility] Emergency Department Provider Note ____________________________________________  Time seen: 1653  I have reviewed the triage vital signs and the nursing notes.  HISTORY  Chief Complaint  Urinary Retention  HPI Suzanne Rhodes is a 37 y.o. female presents to the ED for 1 month complaint of intermittent bladder fullness and a sense of incomplete voiding.  Patient denies any dysuria, hematuria, fevers, or flank pain.  She also denies any significant vaginal discharge.  She does report that she feels "drier than normal.".  She denies any known exposures to STDs.  Past Medical History:  Diagnosis Date  . Medical history non-contributory     Patient Active Problem List   Diagnosis Date Noted  . Preterm premature rupture of membranes, antepartum 12/16/2016  . Labor and delivery indication for care or intervention 12/10/2016  . History of preterm delivery, currently pregnant 12/10/2016  . History of cesarean delivery, currently pregnant 12/10/2016  . History of sudden infant death syndrome in family 12/10/2016  . High-risk pregnancy in third trimester 12/10/2016  . Smoking (tobacco) complicating childbirth 12/10/2016  . Insufficient prenatal care 12/10/2016    Past Surgical History:  Procedure Laterality Date  . CESAREAN SECTION      Prior to Admission medications   Medication Sig Start Date End Date Taking? Authorizing Provider  metroNIDAZOLE (FLAGYL) 500 MG tablet Take 1 tablet (500 mg total) by mouth 2 (two) times daily for 7 days. 09/18/17 09/25/17  Jaymie Mckiddy, Charlesetta IvoryJenise V Bacon, PA-C    Allergies Patient has no known allergies.  History reviewed. No pertinent family history.  Social History Social History   Tobacco Use  . Smoking status: Current Every Day Smoker    Packs/day: 0.50    Types: Cigarettes  . Smokeless tobacco: Never Used  Substance Use Topics  . Alcohol use: No  . Drug use: No    Review of Systems  Constitutional:  Negative for fever. Cardiovascular: Negative for chest pain. Respiratory: Negative for shortness of breath. Gastrointestinal: Negative for abdominal pain, vomiting and diarrhea. Genitourinary: Negative for dysuria. Urinary hesitancy Musculoskeletal: Negative for back pain. Skin: Negative for rash. Neurological: Negative for headaches, focal weakness or numbness. ____________________________________________  PHYSICAL EXAM:  VITAL SIGNS: ED Triage Vitals  Enc Vitals Group     BP 09/18/17 1606 (!) 144/83     Pulse Rate 09/18/17 1606 75     Resp 09/18/17 1606 18     Temp 09/18/17 1606 98.1 F (36.7 C)     Temp Source 09/18/17 1606 Oral     SpO2 09/18/17 1606 100 %     Weight 09/18/17 1606 160 lb (72.6 kg)     Height 09/18/17 1606 5\' 2"  (1.575 m)     Head Circumference --      Peak Flow --      Pain Score 09/18/17 1610 0     Pain Loc --      Pain Edu? --      Excl. in GC? --     Constitutional: Alert and oriented. Well appearing and in no distress. Head: Normocephalic and atraumatic. Cardiovascular: Normal rate, regular rhythm. Normal distal pulses. Respiratory: Normal respiratory effort. No wheezes/rales/rhonchi. GU: Normal external genitalia. Scant, thin, white discharge with musky odor.   Musculoskeletal: Nontender with normal range of motion in all extremities.  Neurologic:  Normal gait without ataxia. Normal speech and language. No gross focal neurologic deficits are appreciated. Skin:  Skin is warm, dry and intact. No rash noted. Psychiatric: Mood and affect are  normal. Patient exhibits appropriate insight and judgment. ____________________________________________   LABS (pertinent positives/negatives) Labs Reviewed  WET PREP, GENITAL - Abnormal; Notable for the following components:      Result Value   Clue Cells Wet Prep HPF POC PRESENT (*)    WBC, Wet Prep HPF POC FEW (*)    All other components within normal limits  URINALYSIS, COMPLETE (UACMP) WITH MICROSCOPIC  - Abnormal; Notable for the following components:   Color, Urine YELLOW (*)    APPearance CLEAR (*)    Hgb urine dipstick SMALL (*)    Squamous Epithelial / LPF 0-5 (*)    All other components within normal limits  CHLAMYDIA/NGC RT PCR ([facility] ONLY)  POC URINE PREG, ED  ____________________________________________  INITIAL IMPRESSION / ASSESSMENT AND PLAN / ED COURSE  Patient with ED evaluation of urinary hesitancy.  Her exam is overall benign but a wet prep does confirm BV.  Patient is discharged with instructions to dose medications as prescribed.  She will follow-up with the [facility] health department for ongoing symptoms.  She may call the ED in 2 hours for results of her GC culture which is pending at the time of the patient's decision to discharge. ____________________________________________  FINAL CLINICAL IMPRESSION(S) / ED DIAGNOSES  Final diagnoses:  Dysuria  BV (bacterial vaginosis)      Karmen Stabs, Charlesetta Ivory, PA-C 09/18/17 1814    Sharman Cheek, MD 09/21/17 [id number]

## 2017-09-18 NOTE — ED Notes (Signed)
See triage note  Presents with urinary freq and pain   Also voiding small amts  No fever or n/v  Also denies any vaginal discharge  States she would like to have her left ear check  Having pain to same

## 2017-09-19 ENCOUNTER — Telehealth: Payer: Self-pay | Admitting: Emergency Medicine

## 2017-09-19 NOTE — Telephone Encounter (Signed)
I called patient to give gc/chlamydia results negative as I had told her I would call her.

## 2017-10-17 IMAGING — US US OB LIMITED
1 series · 14 of 28 positions shown · non-contrast
Comparison: none

CLINICAL DATA: Abdominal pain and vaginal bleeding.

EXAM:
LIMITED OBSTETRIC ULTRASOUND

[Series 1: us ob limited · 0.23mm/px · 14 of 30 slices shown]
[im 2/30]
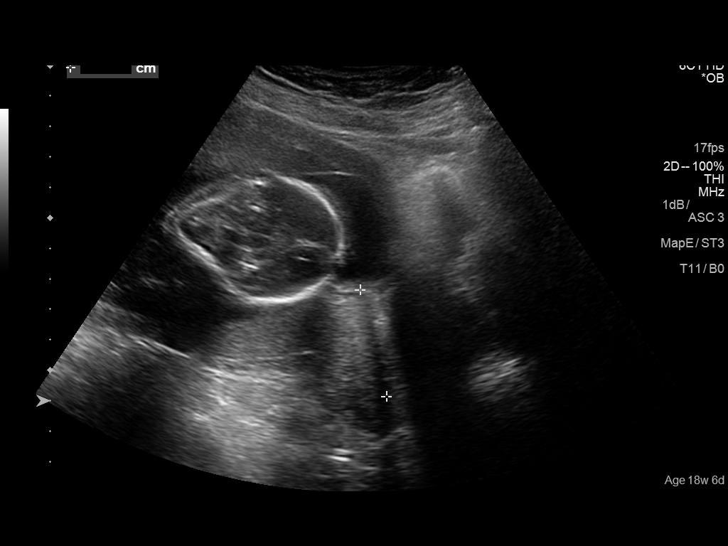
[im 4/30]
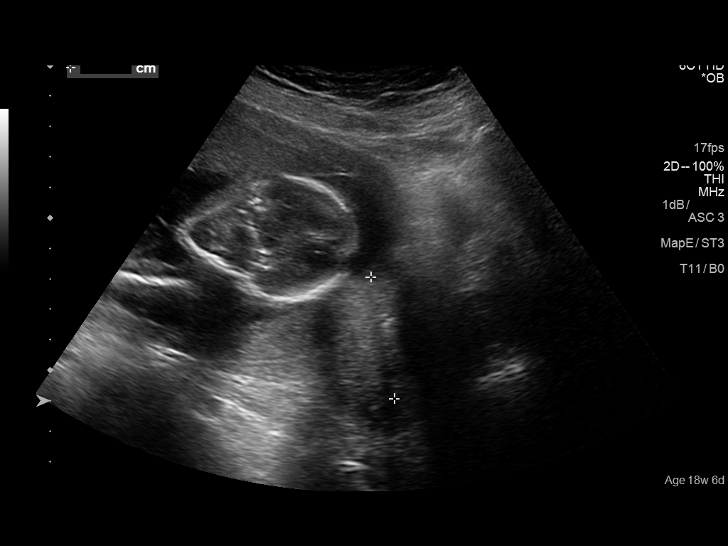
[im 6/30]
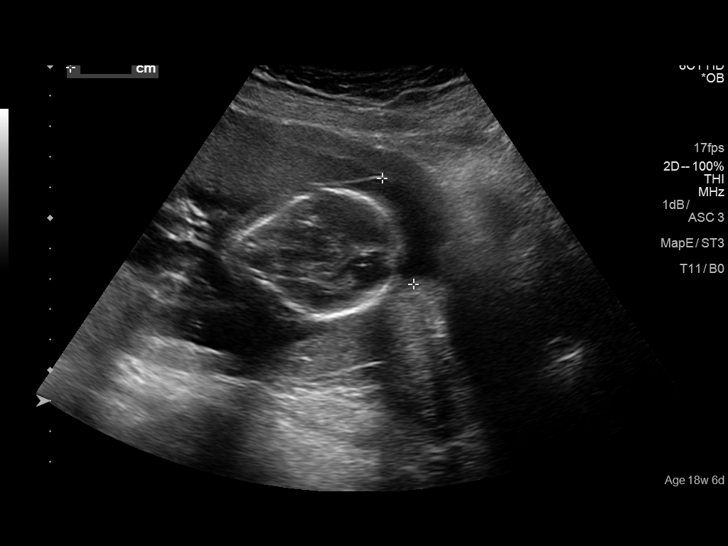
[im 8/30]
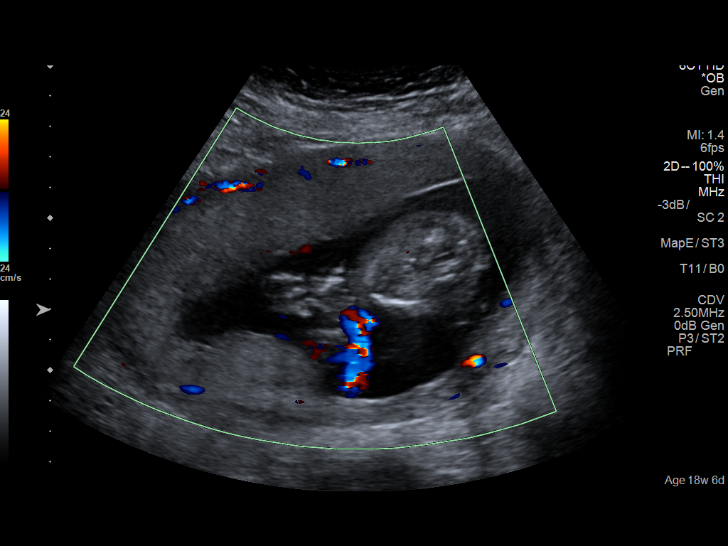
[im 10/30]
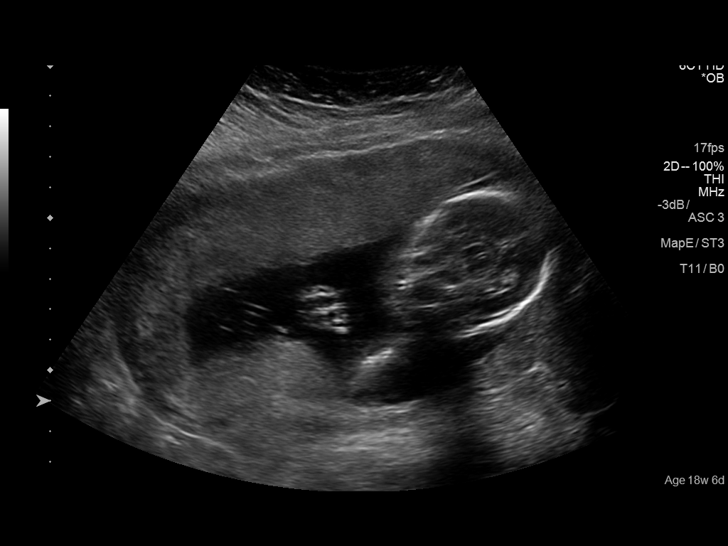
[im 12/30]
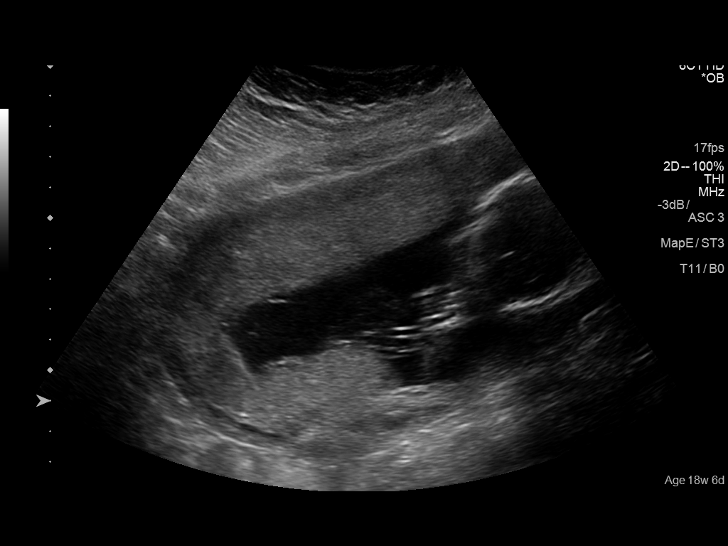
[im 14/30]
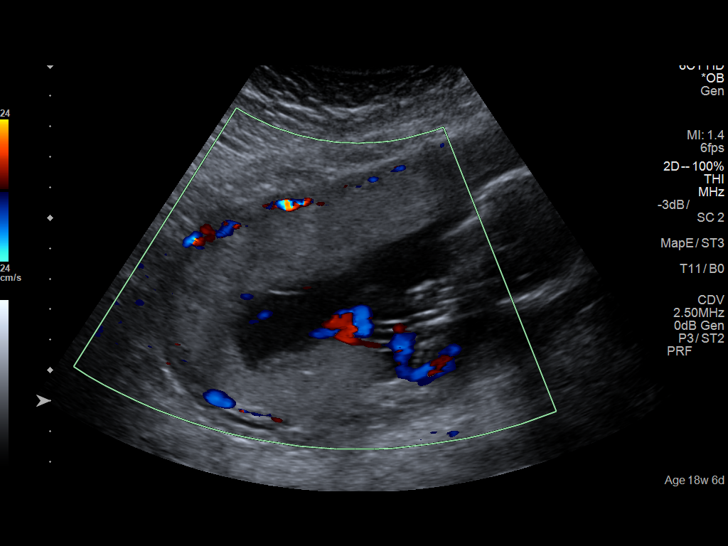
[im 17/30]
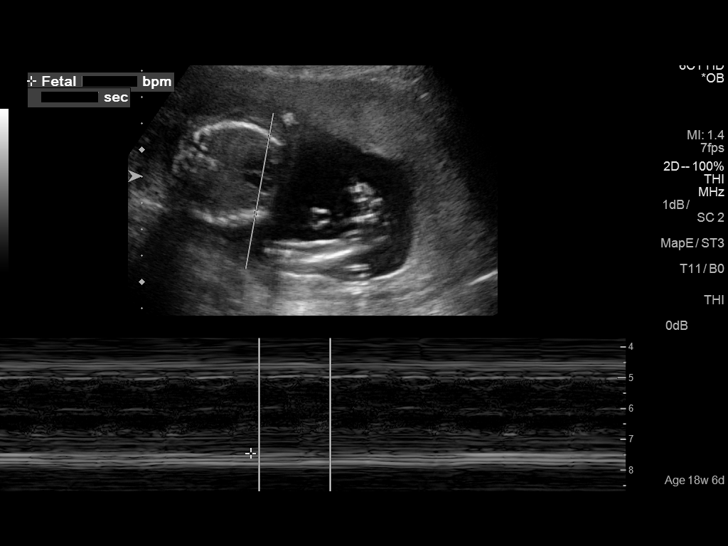
[im 19/30]
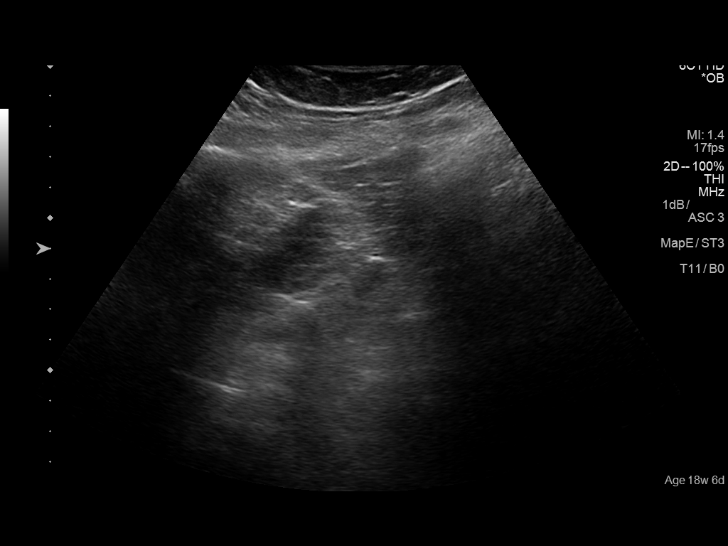
[im 21/30]
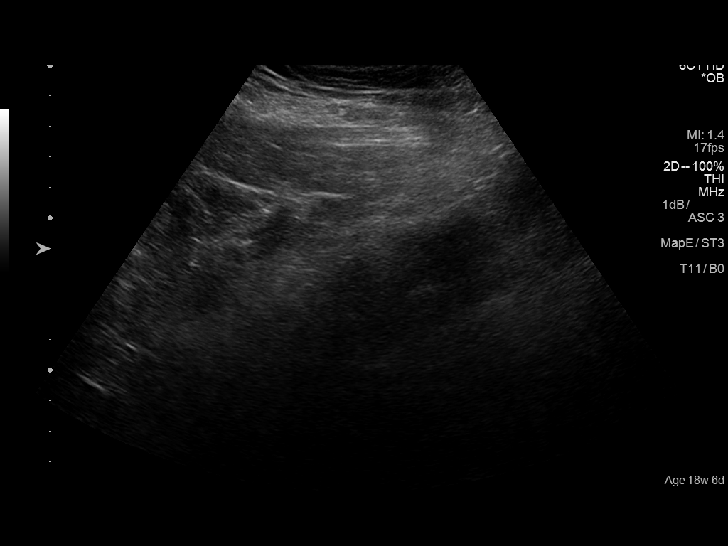
[im 23/30]
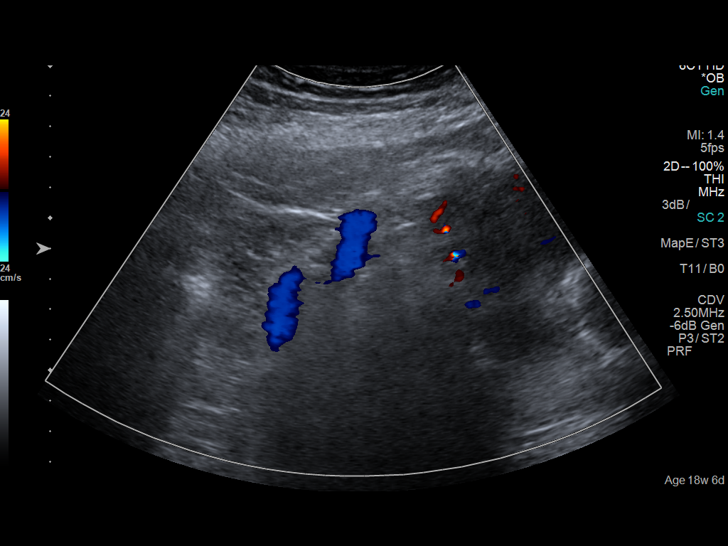
[im 25/30]
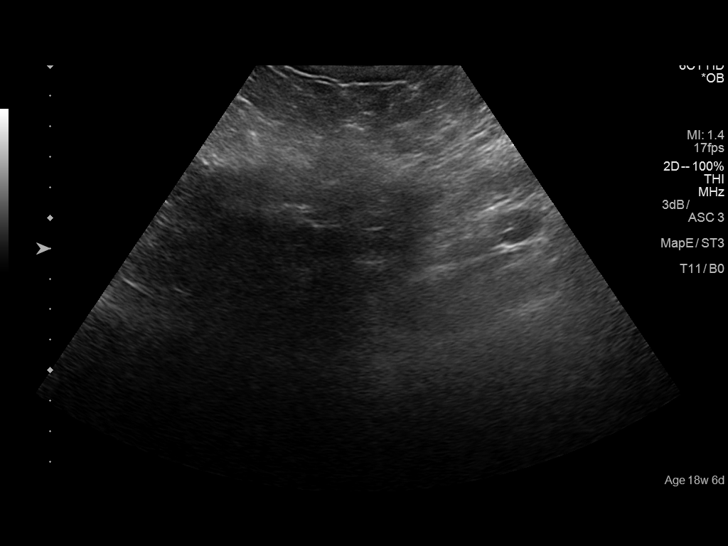
[im 27/30]
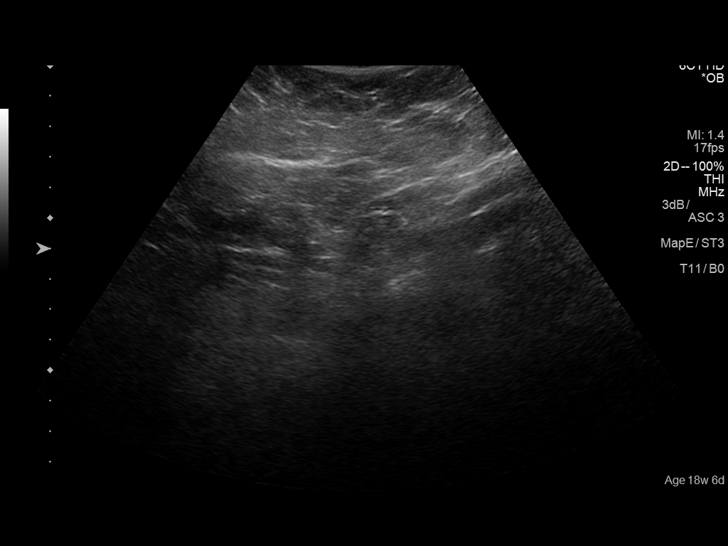
[im 30/30]
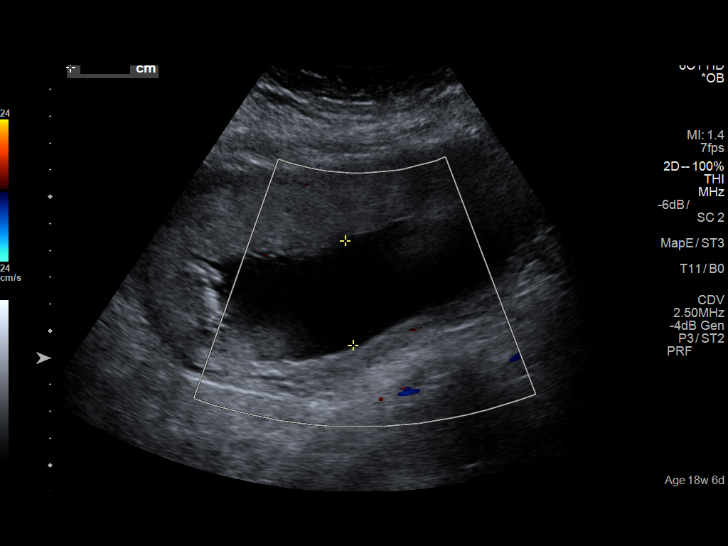

[14 of 28 positions shown; findings below may reference images not displayed]

FINDINGS: Number of Fetuses: 1

Heart Rate:  144 bpm

Movement: Yes

Presentation: Cephalic

Placental Location: Anterior fundal

Previa: No

Amniotic Fluid (Subjective):  Within normal limits.

BPD:  4.4cm 19w  2d

MATERNAL FINDINGS:

Cervix:  Appears closed.

Uterus/Adnexae:  No abnormality visualized.
IMPRESSION: 1. Single living intrauterine gestation. The estimated gestational
age is 19 weeks and 2 days.

This exam is performed on an emergent basis and does not
comprehensively evaluate fetal size, dating, or anatomy; follow-up
complete OB US should be considered if further fetal assessment is
warranted.

## 2018-08-18 ENCOUNTER — Other Ambulatory Visit: Payer: Self-pay

## 2018-08-18 ENCOUNTER — Emergency Department
Admission: EM | Admit: 2018-08-18 | Discharge: 2018-08-18 | Disposition: A | Discharge status: Home / Self Care | Payer: Self-pay | Attending: Emergency Medicine | Admitting: Emergency Medicine

## 2018-08-18 DIAGNOSIS — N898 Other specified noninflammatory disorders of vagina: Secondary | ICD-10-CM | POA: Insufficient documentation

## 2018-08-18 DIAGNOSIS — Z202 Contact with and (suspected) exposure to infections with a predominantly sexual mode of transmission: Secondary | ICD-10-CM | POA: Insufficient documentation

## 2018-08-18 DIAGNOSIS — F17210 Nicotine dependence, cigarettes, uncomplicated: Secondary | ICD-10-CM | POA: Insufficient documentation

## 2018-08-18 LAB — CHLAMYDIA/NGC RT PCR (ARMC ONLY)
Chlamydia Tr: NOT DETECTED
N gonorrhoeae: NOT DETECTED

## 2018-08-18 LAB — WET PREP, GENITAL
Sperm: NONE SEEN
Trich, Wet Prep: NONE SEEN
Yeast Wet Prep HPF POC: NONE SEEN

## 2018-08-18 MED ORDER — METRONIDAZOLE 500 MG PO TABS: 500.0000 mg | ORAL_TABLET | Freq: Two times a day (BID) | ORAL | 0 refills | Status: AC

## 2018-08-18 MED ORDER — AZITHROMYCIN 500 MG PO TABS
1000.0000 mg | ORAL_TABLET | Freq: Once | ORAL | Status: AC
Start: 2018-08-18 — End: 2018-08-18
  Administered 2018-08-18: 1000 mg via ORAL
  Filled 2018-08-18: qty 2

## 2018-08-18 MED ORDER — CEFTRIAXONE SODIUM 250 MG IJ SOLR
250.0000 mg | Freq: Once | INTRAMUSCULAR | Status: AC
  Administered 2018-08-18: 250 mg via INTRAMUSCULAR
  Filled 2018-08-18: qty 250

## 2018-08-18 MED ORDER — METRONIDAZOLE 500 MG PO TABS
2000.0000 mg | ORAL_TABLET | Freq: Once | ORAL | Status: AC
  Administered 2018-08-18: 2000 mg via ORAL
  Filled 2018-08-18: qty 4

## 2018-08-18 NOTE — ED Triage Notes (Signed)
Pt states she was treated for STD and states her partner did not and feels like she has it again.

## 2018-08-18 NOTE — ED Provider Notes (Signed)
[facility] Emergency Department Provider Note  ____________________________________________  Time seen: Approximately 5:32 PM  I have reviewed the triage vital signs and the nursing notes.   HISTORY  Chief Complaint Exposure to STD    HPI Suzanne Rhodes is a 38 y.o. female presents to the emergency department with changes in vaginal discharge.  Patient reports that she was diagnosed with an STD recently and had unprotected sex with her boyfriend who was not treated for an STD.  Patient is unsure what STD she was diagnosed with.  She denies dysuria, hematuria or increased urinary frequency.  She denies dyspareunia or fever at home.  She denies abdominal pain.  No other alleviating measures have been attempted.   Past Medical History:  Diagnosis Date  . Medical history non-contributory     Patient Active Problem List   Diagnosis Date Noted  . Preterm premature rupture of membranes, antepartum 12/16/2016  . Labor and delivery indication for care or intervention 26-Dec-2016  . History of preterm delivery, currently pregnant 2016/12/26  . History of cesarean delivery, currently pregnant 2016/12/26  . History of sudden infant death syndrome in family 12/26/2016  . High-risk pregnancy in third trimester 2016-12-26  . Smoking (tobacco) complicating childbirth 12-26-2016  . Insufficient prenatal care 26-Dec-2016    Past Surgical History:  Procedure Laterality Date  . CESAREAN SECTION      Prior to Admission medications   Not on File    Allergies Patient has no known allergies.  No family history on file.  Social History Social History   Tobacco Use  . Smoking status: Current Every Day Smoker    Packs/day: 0.50    Types: Cigarettes  . Smokeless tobacco: Never Used  Substance Use Topics  . Alcohol use: No  . Drug use: No     Review of Systems  Constitutional: No fever/chills Eyes: No visual changes. No discharge ENT: No upper respiratory  complaints. Cardiovascular: no chest pain. Respiratory: no cough. No SOB. Gastrointestinal: No abdominal pain.  No nausea, no vomiting.  No diarrhea.  No constipation. Genitourinary: Negative for dysuria. No hematuria. Patient has increased vaginal discharge.  Musculoskeletal: Negative for musculoskeletal pain. Skin: Negative for rash, abrasions, lacerations, ecchymosis. Neurological: Negative for headaches, focal weakness or numbness.   ____________________________________________   PHYSICAL EXAM:  VITAL SIGNS: ED Triage Vitals [08/18/18 1415]  Enc Vitals Group     BP (!) 141/80     Pulse Rate 75     Resp 18     Temp 98.5 F (36.9 C)     Temp Source Oral     SpO2 99 %     Weight 160 lb (72.6 kg)     Height 5\' 2"  (1.575 m)     Head Circumference      Peak Flow      Pain Score 0     Pain Loc      Pain Edu?      Excl. in GC?      Constitutional: Alert and oriented. Well appearing and in no acute distress. Eyes: Conjunctivae are normal. PERRL. EOMI. Head: Atraumatic. Cardiovascular: Normal rate, regular rhythm. Normal S1 and S2.  Good peripheral circulation. Respiratory: Normal respiratory effort without tachypnea or retractions. Lungs CTAB. Good air entry to the bases with no decreased or absent breath sounds. Gastrointestinal: Bowel sounds 4 quadrants. Soft and nontender to palpation. No guarding or rigidity. No palpable masses. No distention. No CVA tenderness. Genitourinary: No cervical motion tenderness was elicited.  Musculoskeletal: Full range of motion to all extremities. No gross deformities appreciated. Neurologic:  Normal speech and language. No gross focal neurologic deficits are appreciated.  Skin:  Skin is warm, dry and intact. No rash noted. Psychiatric: Mood and affect are normal. Speech and behavior are normal. Patient exhibits appropriate insight and judgement.   ____________________________________________   LABS (all labs ordered are listed, but  only abnormal results are displayed)  Labs Reviewed  WET PREP, GENITAL - Abnormal; Notable for the following components:      Result Value   Clue Cells Wet Prep HPF POC PRESENT (*)    WBC, Wet Prep HPF POC FEW (*)    All other components within normal limits  CHLAMYDIA/NGC RT PCR ([facility] ONLY)   ____________________________________________  EKG   ____________________________________________  RADIOLOGY   No results found.  ____________________________________________    PROCEDURES  Procedure(s) performed:    Procedures    Medications  cefTRIAXone (ROCEPHIN) injection 250 mg (250 mg Intramuscular Given 08/18/18 1534)  azithromycin (ZITHROMAX) tablet 1,000 mg (1,000 mg Oral Given 08/18/18 1532)  metroNIDAZOLE (FLAGYL) tablet 2,000 mg (2,000 mg Oral Given 08/18/18 1533)     ____________________________________________   INITIAL IMPRESSION / ASSESSMENT AND PLAN / ED COURSE  Pertinent labs & imaging results that were available during my care of the patient were reviewed by me and considered in my medical decision making (see chart for details).  Review of the [place] CSRS was performed in accordance of the [place] prior to dispensing any controlled drugs.     Assessment and plan STD exposure Patient presents to the emergency department after having unprotected sex.  Patient reports that she was recently diagnosed with an STD and had unprotected sex with her boyfriend who was not treated.  Patient was treated with Rocephin, azithromycin and Flagyl in the emergency department.  Wet prep was concerning for bacterial vaginosis and Flagyl was sent to patient's pharmacy.  Patient education regarding safe sex practices were given.  Patient was advised to follow-up with local health department in 3 weeks for repeat testing.  Patient voiced understanding.  All patient questions were answered.    ____________________________________________  FINAL CLINICAL IMPRESSION(S) / ED  DIAGNOSES  Final diagnoses:  STD exposure      NEW MEDICATIONS STARTED DURING THIS VISIT:  ED Discharge Orders    None          This chart was dictated using voice recognition software/Dragon. Despite best efforts to proofread, errors can occur which can change the meaning. Any change was purely unintentional.    Orvil Feil, PA-C 08/18/18 1741    [name], MD 08/18/18 2103

## 2018-08-18 NOTE — ED Notes (Signed)
AAOx3.  Skin warm and dry.  NAD 

## 2018-11-01 ENCOUNTER — Other Ambulatory Visit: Payer: Self-pay

## 2018-11-01 ENCOUNTER — Emergency Department
Admission: EM | Admit: 2018-11-01 | Discharge: 2018-11-01 | Disposition: A | Discharge status: Home / Self Care | Payer: Self-pay

## 2020-06-19 NOTE — L&D Delivery Note (Signed)
Delivery Note  Suzanne Rhodes is a [id number] at [redacted]w[redacted]d with an unknonw LMP, dated by [place] at [redacted]w[redacted]d, with and IUFD at [redacted]w[redacted]d  First Stage: Labor onset: 1400 Augmentation:  None Analgesia /Anesthesia intrapartum: IVPM SROM at 1400  Second Stage: Complete dilation at 1549 Onset of pushing at 1549  Prince presented to L&D in active preterm labor, SROM, and no FHT's on admission.  IUFD was confirmed with [place]. Prolapsed corded with no pulse noted during exam.  She progressed quickly with a strong urge to push.   Delivery of a non-viable baby girl on 06/04/2021 at 1601 by CNM Prolapsed cord presented prior to fetal presenting part. Delivery of fetal sacrum in RSP position with quick restitution to sacrum anterior. Anterior then posterior shoulders delivered easily with no intervention followed quickly by fetal head.  Cord double clamped and cut by CNM.  Infant placed on maternal abdomen and covered with blanket.  Support provided.    Cord blood sample collection: Not Indicated O POS  Third Stage: Oxytocin bolus started after delivery of infant for hemorrhage prophylaxis  Placenta delivered intact but appeared macerated with 3 VC @ 1633 -Placenta evaluated and appears intact with macerated cotyledons  -attempted uterine sweep to assess for retained pieces but unable to reach fundus  -Will give Ancef 2grams IVPB x 1 dose  Placenta disposition: pathology Uterine tone firm / bleeding moderate   No laceration identified  Anesthesia for repair: N/A Repair: N/A Est. Blood Loss (mL):  Complications: IUFD, preterm labor, prolapsed cord   Mom to postpartum.  Baby to [place].  Newborn: Information for the patient's newborn:  Aloura, Matsuoka [[id number]]  Live born female  Birth Weight: 2 lb 1.2 oz (940 g) APGAR: 0, 0  Newborn Delivery   Birth date/time: 06/04/2021 16:01:00 Delivery type: Vaginal, Spontaneous    ---------- [name], CNM Certified Nurse Midwife [place]   [facility]

## 2021-03-30 ENCOUNTER — Other Ambulatory Visit: Payer: Self-pay

## 2021-03-30 ENCOUNTER — Ambulatory Visit (LOCAL_COMMUNITY_HEALTH_CENTER): Payer: Self-pay

## 2021-03-30 VITALS — BP 132/78 | Ht 62.0 in | Wt 168.0 lb

## 2021-03-30 DIAGNOSIS — Z3201 Encounter for pregnancy test, result positive: Secondary | ICD-10-CM

## 2021-03-30 LAB — PREGNANCY, URINE: Preg Test, Ur: POSITIVE — AB

## 2021-03-30 MED ORDER — PRENATAL 27-0.8 MG PO TABS: 1.0000 | ORAL_TABLET | Freq: Every day | ORAL | 0 refills | Status: AC

## 2021-03-30 NOTE — Progress Notes (Signed)
UPT positive. Hx preterm labor. Per pt, delivers typically between 6 - 7.5 months gestation. Hx of SIDS death at 74 mo of age [birth year]). Pt expresses interest in prenatal care at [facility].  Consult Elveria Rising, FNP who recommends pt to establish prenatal care ASAP from a high risk provider as she would be considered high risk from her hx of preterm deliveries, currently 6 th pregnancy, late entry into care and advanced maternal age. High risk provider can provide the specialized/higher level care needed for pt. RN discussed with pt the provider recommendations and pt agrees and reports understanding. She states she will call [facility] or [facility] for appt. RN advises pt to contact provider ASAP. List of prenatal high risk providers given to pt. Questions answered and reports understanding. To DSS/Medicaid preg women. Jerel Shepherd, RN

## 2021-04-09 NOTE — Progress Notes (Signed)
Consulted by RN re: patient situation.  Reviewed RN note and agree that it reflects our discussion and my recommendations.  ° ° °Aniqa Hare, FNP  °

## 2021-05-16 ENCOUNTER — Other Ambulatory Visit: Payer: Self-pay

## 2021-05-16 ENCOUNTER — Inpatient Hospital Stay
Admission: EM | Admit: 2021-05-16 | Discharge: 2021-05-16 | Disposition: A | Discharge status: Home / Self Care | Payer: Self-pay | Admitting: Obstetrics and Gynecology

## 2021-05-16 NOTE — Discharge Summary (Signed)
Suzanne Rhodes is a 40 y.o. female. She is at [redacted]w[redacted]d gestation. Patient's last menstrual period was 12/01/2020 (within weeks). Estimated Date of Delivery: 09/07/21  Prenatal care site: none - initial dating US performed at [facility] 04/15/21 Normal anatomy seen Single, viable IUP, S=[redacted]w[redacted]d FHR=152bpm Cervical length=4.13cm B/L ovaries appear wnl Placenta=anterior Position=variable  Current pregnancy complicated by: NO PNC, grand multip, multiple preterm deliveries  Chief complaint: c/o abdominal pain   Maternal Medical History:   Past Medical History:  Diagnosis Date   Medical history non-contributory     Past Surgical History:  Procedure Laterality Date   CESAREAN SECTION      No Known Allergies  Prior to Admission medications   Medication Sig Start Date End Date Taking? Authorizing Provider  Prenatal Vit-Fe Fumarate-FA (MULTIVITAMIN-PRENATAL) 27-0.8 MG TABS tablet Take 1 tablet by mouth daily at 12 noon. 03/30/21 07/08/21  Federico Flake, MD   OB History  Gravida Para Term Preterm AB Living  6 5 0 5 0 4  SAB IAB Ectopic Multiple Live Births  0 0 0 0 5    # Outcome Date GA Lbr Len/2nd Weight Sex Delivery Anes PTL Lv  6 Current           5 Preterm 12/16/16 [redacted]w[redacted]d 11:41 / 00:04 1871 g F VBAC None  LIV  4 Preterm 05/30/14    M CS-LTranv   LIV  3 Preterm 06/16/07    M Vag-Spont   DEC  2 Preterm 01/30/02    M Vag-Spont   LIV  1 Preterm 12/02/00    F Vag-Spont   LIV      Social History: She  reports that she has been smoking cigarettes. She has been smoking an average of .5 packs per day. She has never used smokeless tobacco. She reports that she does not drink alcohol and does not use drugs.  Family History: family history is not on file.   Review of Systems: unable to perform H&P   O:  BP 135/74 (BP Location: Right Arm)   Pulse 92   Temp 98.9 F (37.2 C) (Oral)   Resp 16   LMP 12/01/2020 (Within Weeks)  No results found for this or any previous visit  (from the past 48 hour(s)).    Fetal  monitoring: Cat I Appropriate for GA Baseline: 165bpm Variability: moderate Accelerations: absent  Decelerations absent  Toco: no UCs traced    A/P: 40 y.o. [redacted]w[redacted]d here for antenatal surveillance for abdominal pain  Principle Diagnosis:  High risk pregnancy in 2nd trimester  Pt left AMA prior to assessment by CNM   Randa Ngo, CNM 05/16/2021  5:24 PM

## 2021-05-16 NOTE — OB Triage Note (Signed)
Patient presents with c/o pain in lower abdomen that started this morning when she woke up and has continued throughout the day. The pain comes and goes 8/10 on the pain scale. She hasn't taken any OTC for pain.

## 2021-05-16 NOTE — Progress Notes (Signed)
Patient came with c/o abdominal pain, FHT monitors were applied at 15:55 and a FHT captured was 160, variability present. No ctx were captured with the TOCO.  RN completed triage and left the room at 16:13 to call CNM R. Mcvey to update her on the patient and her status. At 16:21 the RN noticed the monitors were not picking up, RN checked the room and the patient had changed back in to her clothes and left the unit. Midwife aware

## 2021-06-04 ENCOUNTER — Encounter: Payer: Self-pay | Admitting: Obstetrics and Gynecology

## 2021-06-04 ENCOUNTER — Inpatient Hospital Stay: Payer: Self-pay

## 2021-06-04 ENCOUNTER — Inpatient Hospital Stay
Admission: EM | Admit: 2021-06-04 | Discharge: 2021-06-04 | DRG: 806 | Disposition: A | Discharge status: Home / Self Care | Payer: Self-pay

## 2021-06-04 DIAGNOSIS — O99334 Smoking (tobacco) complicating childbirth: Secondary | ICD-10-CM | POA: Diagnosis present

## 2021-06-04 DIAGNOSIS — F17210 Nicotine dependence, cigarettes, uncomplicated: Secondary | ICD-10-CM | POA: Diagnosis present

## 2021-06-04 DIAGNOSIS — O0930 Supervision of pregnancy with insufficient antenatal care, unspecified trimester: Secondary | ICD-10-CM

## 2021-06-04 DIAGNOSIS — Z3689 Encounter for other specified antenatal screening: Secondary | ICD-10-CM

## 2021-06-04 DIAGNOSIS — Z3490 Encounter for supervision of normal pregnancy, unspecified, unspecified trimester: Secondary | ICD-10-CM

## 2021-06-04 DIAGNOSIS — O34219 Maternal care for unspecified type scar from previous cesarean delivery: Secondary | ICD-10-CM | POA: Diagnosis present

## 2021-06-04 DIAGNOSIS — O321XX0 Maternal care for breech presentation, not applicable or unspecified: Secondary | ICD-10-CM | POA: Diagnosis present

## 2021-06-04 DIAGNOSIS — O42912 Preterm premature rupture of membranes, unspecified as to length of time between rupture and onset of labor, second trimester: Secondary | ICD-10-CM | POA: Diagnosis present

## 2021-06-04 DIAGNOSIS — Z20822 Contact with and (suspected) exposure to covid-19: Secondary | ICD-10-CM | POA: Diagnosis present

## 2021-06-04 DIAGNOSIS — O364XX0 Maternal care for intrauterine death, not applicable or unspecified: Secondary | ICD-10-CM | POA: Diagnosis present

## 2021-06-04 DIAGNOSIS — Z3A26 26 weeks gestation of pregnancy: Secondary | ICD-10-CM

## 2021-06-04 LAB — CBC WITH DIFFERENTIAL/PLATELET
Abs Immature Granulocytes: 0.05 10*3/uL (ref 0.00–0.07)
Basophils Absolute: 0 10*3/uL (ref 0.0–0.1)
Basophils Relative: 0 %
Eosinophils Absolute: 0 10*3/uL (ref 0.0–0.5)
Eosinophils Relative: 0 %
HCT: 34.8 % — ABNORMAL LOW (ref 36.0–46.0)
Hemoglobin: 11.6 g/dL — ABNORMAL LOW (ref 12.0–15.0)
Immature Granulocytes: 0 %
Lymphocytes Relative: 9 %
Lymphs Abs: 1 10*3/uL (ref 0.7–4.0)
MCH: 29.1 pg (ref 26.0–34.0)
MCHC: 33.3 g/dL (ref 30.0–36.0)
MCV: 87.4 fL (ref 80.0–100.0)
Monocytes Absolute: 0.6 10*3/uL (ref 0.1–1.0)
Monocytes Relative: 5 %
Neutro Abs: 9.9 10*3/uL — ABNORMAL HIGH (ref 1.7–7.7)
Neutrophils Relative %: 86 %
Platelets: 239 10*3/uL (ref 150–400)
RBC: 3.98 MIL/uL (ref 3.87–5.11)
RDW: 12.9 % (ref 11.5–15.5)
WBC: 11.6 10*3/uL — ABNORMAL HIGH (ref 4.0–10.5)
nRBC: 0 % (ref 0.0–0.2)

## 2021-06-04 LAB — URINE DRUG SCREEN, QUALITATIVE (ARMC ONLY)
Amphetamines, Ur Screen: NOT DETECTED
Barbiturates, Ur Screen: NOT DETECTED
Benzodiazepine, Ur Scrn: NOT DETECTED
Cannabinoid 50 Ng, Ur ~~LOC~~: NOT DETECTED
Cocaine Metabolite,Ur ~~LOC~~: POSITIVE — AB
MDMA (Ecstasy)Ur Screen: NOT DETECTED
Methadone Scn, Ur: NOT DETECTED
Opiate, Ur Screen: NOT DETECTED
Phencyclidine (PCP) Ur S: NOT DETECTED
Tricyclic, Ur Screen: NOT DETECTED

## 2021-06-04 LAB — RAPID HIV SCREEN (HIV 1/2 AB+AG)
HIV 1/2 Antibodies: NONREACTIVE
HIV-1 P24 Antigen - HIV24: NONREACTIVE

## 2021-06-04 LAB — PROTIME-INR
INR: 1 (ref 0.8–1.2)
Prothrombin Time: 13.4 seconds (ref 11.4–15.2)

## 2021-06-04 LAB — COMPREHENSIVE METABOLIC PANEL
ALT: 12 U/L (ref 0–44)
AST: 14 U/L — ABNORMAL LOW (ref 15–41)
Albumin: 3.2 g/dL — ABNORMAL LOW (ref 3.5–5.0)
Alkaline Phosphatase: 83 U/L (ref 38–126)
Anion gap: 7 (ref 5–15)
BUN: 7 mg/dL (ref 6–20)
CO2: 23 mmol/L (ref 22–32)
Calcium: 8.8 mg/dL — ABNORMAL LOW (ref 8.9–10.3)
Chloride: 105 mmol/L (ref 98–111)
Creatinine, Ser: <0.3 mg/dL — ABNORMAL LOW (ref 0.44–1.00)
Glucose, Bld: 139 mg/dL — ABNORMAL HIGH (ref 70–99)
Potassium: 3 mmol/L — ABNORMAL LOW (ref 3.5–5.1)
Sodium: 135 mmol/L (ref 135–145)
Total Bilirubin: 0.3 mg/dL (ref 0.3–1.2)
Total Protein: 7.2 g/dL (ref 6.5–8.1)

## 2021-06-04 LAB — HEPATITIS B SURFACE ANTIGEN: Hepatitis B Surface Ag: NONREACTIVE

## 2021-06-04 LAB — TYPE AND SCREEN
ABO/RH(D): O POS
Antibody Screen: NEGATIVE

## 2021-06-04 LAB — RESP PANEL BY RT-PCR (FLU A&B, COVID) ARPGX2
Influenza A by PCR: NEGATIVE
Influenza B by PCR: NEGATIVE
SARS Coronavirus 2 by RT PCR: NEGATIVE

## 2021-06-04 LAB — HCG, QUANTITATIVE, PREGNANCY: hCG, Beta Chain, Quant, S: 45229 m[IU]/mL — ABNORMAL HIGH (ref <5)

## 2021-06-04 MED ORDER — LACTATED RINGERS IV SOLN: INTRAVENOUS | Status: DC

## 2021-06-04 MED ORDER — LIDOCAINE HCL (PF) 1 % IJ SOLN: 30.0000 mL | INTRAMUSCULAR | Status: DC | PRN

## 2021-06-04 MED ORDER — LACTATED RINGERS IV SOLN: 500.0000 mL | INTRAVENOUS | Status: DC | PRN

## 2021-06-04 MED ORDER — FENTANYL CITRATE (PF) 100 MCG/2ML IJ SOLN
50.0000 ug | INTRAMUSCULAR | Status: DC | PRN
  Administered 2021-06-04: 100 ug via INTRAVENOUS
  Filled 2021-06-04: qty 2

## 2021-06-04 MED ORDER — FENTANYL CITRATE (PF) 100 MCG/2ML IJ SOLN
INTRAMUSCULAR | Status: AC
  Administered 2021-06-04: 100 ug
  Filled 2021-06-04: qty 2

## 2021-06-04 MED ORDER — ONDANSETRON HCL 4 MG/2ML IJ SOLN: 4.0000 mg | Freq: Four times a day (QID) | INTRAMUSCULAR | Status: DC | PRN

## 2021-06-04 MED ORDER — SOD CITRATE-CITRIC ACID 500-334 MG/5ML PO SOLN: 30.0000 mL | ORAL | Status: DC | PRN

## 2021-06-04 MED ORDER — OXYTOCIN BOLUS FROM INFUSION
333.0000 mL | Freq: Once | INTRAVENOUS | Status: AC
  Administered 2021-06-04: 333 mL via INTRAVENOUS

## 2021-06-04 MED ORDER — CEFAZOLIN SODIUM-DEXTROSE 2-4 GM/100ML-% IV SOLN: 2.0000 g | Freq: Once | INTRAVENOUS | Status: DC

## 2021-06-04 MED ORDER — OXYTOCIN-SODIUM CHLORIDE 30-0.9 UT/500ML-% IV SOLN: 2.5000 [IU]/h | INTRAVENOUS | Status: DC

## 2021-06-04 MED ORDER — ACETAMINOPHEN 500 MG PO TABS: 1000.0000 mg | ORAL_TABLET | Freq: Four times a day (QID) | ORAL | Status: DC | PRN

## 2021-06-04 MED ORDER — IBUPROFEN 600 MG PO TABS: 600.0000 mg | ORAL_TABLET | Freq: Four times a day (QID) | ORAL | 0 refills | Status: DC | PRN

## 2021-06-04 NOTE — Progress Notes (Addendum)
Pt left floor at approximately 1945 in order to step outside. Pt was DC and stated that she would be back for paperwork. Pt not returned by 2045 and this RN called pt with no response. Pt still has some places to initial or sign on fetal demise paperwork and stated prior to leaving that she would come back in the AM for the memory box. Charge nurse aware and AC being made aware at this time.

## 2021-06-04 NOTE — Progress Notes (Signed)
Suzanne Rhodes was adamant about leaving the floor for a cigarette.  She was offered nicotine patches multiple time and she declined.  I explained that I would not recommend her leaving the floor at this time d/t recent delivery and it's important for [place] to assess for vaginal bleeding.  She has not yet had time to complete paperwork for her infant yet.  Patient was again insistent on leaving the floor and stated that her sister was waiting for her and she had a birthday party to get to.  I informed her that the birthday party was not a good idea to attend and we would need to watch her for several hours at least.  IV was removed prior to [place] ambulating off the floor to go smoke.    [name], CNM

## 2021-06-04 NOTE — ED Provider Notes (Addendum)
[facility] Emergency Department Provider Note  ____________________________________________   Event Date/Time   First MD Initiated Contact with Patient 06/04/21 1436     (approximate)  I have reviewed the triage vital signs and the nursing notes.   HISTORY  Chief Complaint No chief complaint on file.   HPI Suzanne Rhodes is a 40 y.o. 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  Patient states she think she is about 7 months along with her pregnancy.  She has been getting prenatal care.  She states otherwise she has been in her usual state health without any recent fevers, chills, cough, vomiting, diarrhea, rash, burning with urination or chest pain or abdominal pain.  She states she is currently not in pain but also contractions earlier today.         Past Medical History:  Diagnosis Date   Medical history non-contributory     Patient Active Problem List   Diagnosis Date Noted   Preterm labor 06/04/2021   Preterm premature rupture of membranes, antepartum 12/16/2016   History of preterm delivery, currently pregnant December 17, 2016   History of cesarean delivery, currently pregnant 2016/12/17   History of sudden infant death syndrome in family December 17, 2016   Smoking (tobacco) complicating childbirth 2016-12-17   Insufficient prenatal care 17-Dec-2016   Tobacco smoking affecting pregnancy in second trimester 10/02/2016    Past Surgical History:  Procedure Laterality Date   CESAREAN SECTION      Prior to Admission medications   Medication Sig Start Date End Date Taking? Authorizing Provider  Prenatal Vit-Fe Fumarate-FA (MULTIVITAMIN-PRENATAL) 27-0.8 MG TABS tablet Take 1 tablet by mouth daily at 12 noon. 03/30/21 07/08/21  Federico Flake, MD    Allergies Patient has no known  allergies.  No family history on file.  Social History Social History   Tobacco Use   Smoking status: Every Day    Packs/day: 0.50    Types: Cigarettes   Smokeless tobacco: Never  Vaping Use   Vaping Use: Never used  Substance Use Topics   Alcohol use: No   Drug use: No    Review of Systems  Review of Systems  Constitutional:  Negative for chills and fever.  HENT:  Negative for sore throat.   Eyes:  Negative for pain.  Respiratory:  Negative for cough and stridor.   Cardiovascular:  Negative for chest pain.  Gastrointestinal:  Positive for abdominal pain (earlier now resolved). Negative for vomiting.  Skin:  Negative for rash.  Neurological:  Negative for seizures, loss of consciousness and headaches.  Psychiatric/Behavioral:  Negative for suicidal ideas.   All other systems reviewed and are negative.    ____________________________________________   PHYSICAL EXAM:  VITAL SIGNS: ED Triage Vitals  Enc Vitals Group     BP      Pulse      Resp      Temp      Temp src      SpO2      Weight      Height      Head Circumference      Peak Flow      Pain Score      Pain Loc      Pain Edu?      Excl. in GC?    Vitals:   06/04/21 1619 06/04/21 1652  BP:  136/69 139/76  Pulse: 100 (!) 108  SpO2:     Physical Exam Vitals and nursing note reviewed.  Constitutional:      General: She is not in acute distress.    Appearance: She is well-developed.  HENT:     Head: Normocephalic and atraumatic.     Right Ear: External ear normal.     Left Ear: External ear normal.     Nose: Nose normal.  Eyes:     Conjunctiva/sclera: Conjunctivae normal.  Cardiovascular:     Rate and Rhythm: Normal rate and regular rhythm.     Heart sounds: No murmur heard. Pulmonary:     Effort: Pulmonary effort is normal. No respiratory distress.     Breath sounds: Normal breath sounds.  Abdominal:     Palpations: Abdomen is soft.     Tenderness: There is no abdominal tenderness.   Musculoskeletal:        General: No swelling.     Cervical back: Neck supple.  Skin:    General: Skin is warm and dry.     Capillary Refill: Capillary refill takes less than 2 seconds.  Neurological:     Mental Status: She is alert.  Psychiatric:        Mood and Affect: Mood normal.    Grossly but soft abdomen.  Gravid but soft abdomen. ____________________________________________   LABS (all labs ordered are listed, but only abnormal results are displayed)  Labs Reviewed  CBC WITH DIFFERENTIAL/PLATELET - Abnormal; Notable for the following components:      Result Value   WBC 11.6 (*)    Hemoglobin 11.6 (*)    HCT 34.8 (*)    Neutro Abs 9.9 (*)    All other components within normal limits  COMPREHENSIVE METABOLIC PANEL - Abnormal; Notable for the following components:   Potassium 3.0 (*)    Glucose, Bld 139 (*)    Creatinine, Ser <0.30 (*)    Calcium 8.8 (*)    Albumin 3.2 (*)    AST 14 (*)    All other components within normal limits  HCG, QUANTITATIVE, PREGNANCY - Abnormal; Notable for the following components:   hCG, Beta Chain, Quant, S 45,229 (*)    All other components within normal limits  URINE DRUG SCREEN, QUALITATIVE ([facility] ONLY) - Abnormal; Notable for the following components:   Cocaine Metabolite,Ur [place] POSITIVE (*)    All other components within normal limits  RESP PANEL BY RT-PCR (FLU A&B, COVID) ARPGX2  PROTIME-INR  RAPID HIV SCREEN (HIV 1/2 AB+AG)  HEPATITIS B SURFACE ANTIGEN  RUBELLA SCREEN  RPR  HCV AB W REFLEX TO QUANT PCR  VARICELLA ZOSTER ANTIBODY, IGG  TYPE AND SCREEN  SURGICAL PATHOLOGY   ____________________________________________  EKG  ____________________________________________  RADIOLOGY  ED MD interpretation:    Official radiology report(s): US OB Limited  Result Date: 06/04/2021 CLINICAL DATA:  No fetal heart tones on bedside ultrasound, premature rupture of membranes, history of preterm labor EXAM: LIMITED OBSTETRIC  ULTRASOUND COMPARISON:  Prior pelvic ultrasound FINDINGS: Number of Fetuses: 1 Heart Rate:  No detectable cardiac activity Movement: No Presentation: Transverse Placental Location: Anterior Previa: No Amniotic Fluid (Subjective):  Decreased BPD: 6.6 cm 26 w  3 d MATERNAL FINDINGS: Cervix:  Not visualized Uterus/Adnexae: No abnormality visualized. Ultrasound tech note: Very limited exam. Provider told us tech to stop exam due to baby's shoulder and umbilical cord presenting and no pulse within cord. IMPRESSION: Single intrauterine pregnancy. No detectable cardiac activity. Decreased amniotic fluid.  Very limited exam due to patient and fetus condition. This exam is performed on an emergent basis and does not comprehensively evaluate fetal size, dating, or anatomy; follow-up complete OB US should be considered if further fetal assessment is warranted. Electronically Signed   By: Caprice Renshaw M.D.   On: 06/04/2021 15:43    ____________________________________________   PROCEDURES  Procedure(s) performed (including Critical Care):  Procedures   ____________________________________________   INITIAL IMPRESSION / ASSESSMENT AND PLAN / ED COURSE        Patient presents with above-stated history exam for assessment after she experienced a gush of fluid today associate with some bleeding.  There is also some cyst contractions which have improved and she is denying any abdominal pain on arrival.  No other acute concerns at this time.  On arrival she is afebrile hemodynamically stable.    Abdomen is soft throughout.   Dx includes PROM, abruption, previa, endometritis, and threatened incomplete miscarriage.   CMP obtained remarkable for K 3 without other significant electrolyte or metabolic derangements. hCG 48,250. CBC remarkable for WBC 11.6, Hbg 11.6, and normal plt. INR WNL. HIV screen neg.  Screening COVID/Influenza PCR Neg .   Given patient is past 20-week she was immediately transported to L&D  for further evaluation and management.      ____________________________________________   FINAL CLINICAL IMPRESSION(S) / ED DIAGNOSES  Final diagnoses:  Pregnancy, unspecified gestational age    Medications  lactated ringers infusion (has no administration in time range)  oxytocin (PITOCIN) IV infusion 30 units in NS 500 mL - Premix (has no administration in time range)  lactated ringers infusion 500-1,000 mL (has no administration in time range)  acetaminophen (TYLENOL) tablet 1,000 mg (has no administration in time range)  ondansetron (ZOFRAN) injection 4 mg (has no administration in time range)  sodium citrate-citric acid (ORACIT) solution 30 mL (has no administration in time range)  lidocaine (PF) (XYLOCAINE) 1 % injection 30 mL (has no administration in time range)  fentaNYL (SUBLIMAZE) injection 50-100 mcg (has no administration in time range)  ceFAZolin (ANCEF) IVPB 2g/100 mL premix (has no administration in time range)  fentaNYL (SUBLIMAZE) 100 MCG/2ML injection (100 mcg  Given 06/04/21 1523)  oxytocin (PITOCIN) IV BOLUS FROM BAG (333 mLs Intravenous Bolus from Bag 06/04/21 1612)     ED Discharge Orders     None        Note:  This document was prepared using Dragon voice recognition software and may include unintentional dictation errors.    Gilles Chiquito, MD 06/04/21 1527    Gilles Chiquito, MD 06/04/21 1736    Gilles Chiquito, MD 06/04/21 [phone]

## 2021-06-04 NOTE — Progress Notes (Addendum)
Patient left floor at 1713 to go outside to smoke a cigarette.  Patient was encouraged to remain on the unit for recovery and educated about risk of leaving the hospital.  IV pitocin bolus completed and IV removed(1710)before patient left the floor. [name] CNM in department and aware of patient insisting on going outside. 1813 patient returned to LDR 6.  Patient anxious to sign cremation release papers and leave to go home.  A Mackie CNM spoke with patient about followup after discharge.  1920 patient went outside to smoke a cigarette.  She verbalized that she would come back and finish signing paperwork for discharge.

## 2021-06-04 NOTE — H&P (Signed)
OB History & Physical   History of Present Illness:   Chief Complaint: contractions, vaginal bleeding, LOF  HPI:  Suzanne Rhodes is a 40 y.o. [phone] female at [redacted]w[redacted]d dated by [place] at 72w2s.  She presents to L&D for contractions, vaginal bleeding, and leakage of fluid.  She initially presented to the ED via EMS and was quickly evaluated and brought to L&D.  Sheanna has not established prenatal care but had an anatomy US performed at 19 weeks.  Unable to find fetal heart tones with EFM.  I was present at the bedside and [place] was in the room.  Attempted to find FHT with [place] and no cardiac activity was seen.  Presentation was oblique versus transverse, difficult to assess d/t [place] needing to move frequently r/t pain with contractions.  Prolapsed cord felt during exam with no pulse palpated. Dr. Valentino Saxon notified of admission and presentation.  Will attempt to get [facility] comfortable with epidural and then assist with helping to turn fetal position if necessary.    Reports active fetal movement earlier today  Contractions: every 2 to 3 minutes LOF/SROM: gush of fluid at 1400 Vaginal bleeding: bloody show  Factors complicating pregnancy:  No prenatal care  History of cesarean birth followed by VBAC  Tobacco use in pregnancy  History of preterm birth x 5 History of SIDS at 3 months   Patient Active Problem List   Diagnosis Date Noted   Preterm labor 06/04/2021   Preterm premature rupture of membranes, antepartum 12/16/2016   History of preterm delivery, currently pregnant 12/13/16   History of cesarean delivery, currently pregnant 12/13/16   History of sudden infant death syndrome in family 12/13/16   Smoking (tobacco) complicating childbirth December 13, 2016   Insufficient prenatal care 12/13/16   Tobacco smoking affecting pregnancy in second trimester 10/02/2016     Maternal Medical History:   Past Medical History:  Diagnosis Date   Medical history non-contributory     Past  Surgical History:  Procedure Laterality Date   CESAREAN SECTION      No Known Allergies  Prior to Admission medications   Medication Sig Start Date End Date Taking? Authorizing Provider  Prenatal Vit-Fe Fumarate-FA (MULTIVITAMIN-PRENATAL) 27-0.8 MG TABS tablet Take 1 tablet by mouth daily at 12 noon. 03/30/21 07/08/21  Federico Flake, MD     Prenatal care site:  No established care  Social History: She  reports that she has been smoking cigarettes. She has been smoking an average of .5 packs per day. She has never used smokeless tobacco. She reports that she does not drink alcohol and does not use drugs.  Family History: Unable to get family history d/t immanent delivery    Review of Systems: A full review of systems was performed and negative except as noted in the HPI.     Physical Exam:  Vital Signs: LMP 12/01/2020 (Within Weeks)  Physical Exam  General: no acute distress.  HEENT: normocephalic, atraumatic Heart: regular rate & rhythm.  No murmurs/rubs/gallops Lungs: clear to auscultation bilaterally, normal respiratory effort Abdomen: soft, gravid, non-tender;  EFW: 2 lbs  Pelvic:   External: Normal external female genitalia  Cervix: Dilation: 6 /   /      Extremities: non-tender, symmetric, no edema bilaterally.  DTRs: 2+/2+  Neurologic: Alert & oriented x 3.    Results for orders placed or performed during the hospital encounter of 06/04/21 (from the past 24 hour(s))  CBC with Differential     Status: Abnormal   Collection  Time: 06/04/21  3:17 PM  Result Value Ref Range   WBC 11.6 (H) 4.0 - 10.5 K/uL   RBC 3.98 3.87 - 5.11 MIL/uL   Hemoglobin 11.6 (L) 12.0 - 15.0 g/dL   HCT 28.7 (L) 68.1 - 15.7 %   MCV 87.4 80.0 - 100.0 fL   MCH 29.1 26.0 - 34.0 pg   MCHC 33.3 30.0 - 36.0 g/dL   RDW 26.2 03.5 - 59.7 %   Platelets 239 150 - 400 K/uL   nRBC 0.0 0.0 - 0.2 %   Neutrophils Relative % 86 %   Neutro Abs 9.9 (H) 1.7 - 7.7 K/uL   Lymphocytes Relative 9 %    Lymphs Abs 1.0 0.7 - 4.0 K/uL   Monocytes Relative 5 %   Monocytes Absolute 0.6 0.1 - 1.0 K/uL   Eosinophils Relative 0 %   Eosinophils Absolute 0.0 0.0 - 0.5 K/uL   Basophils Relative 0 %   Basophils Absolute 0.0 0.0 - 0.1 K/uL   Immature Granulocytes 0 %   Abs Immature Granulocytes 0.05 0.00 - 0.07 K/uL    Pertinent Results:  Prenatal Labs: Blood type/Rh O pos  Antibody screen neg  Rubella Pending  Varicella Pending   RPR Pending   HBsAg Peneding   HIV Neg  GC Not collected   Chlamydia Not collected   Genetic screening Not done - no PNC  1 hour GTT N/A  3 hour GTT N/A  GBS Unknown    FHT: absent - unable to find FHT UC:   regular, every 1-2 minutes   Transverse by [place] and SVE   US OB Limited  Result Date: 06/04/2021 CLINICAL DATA:  No fetal heart tones on bedside ultrasound, premature rupture of membranes, history of preterm labor EXAM: LIMITED OBSTETRIC ULTRASOUND COMPARISON:  Prior pelvic ultrasound FINDINGS: Number of Fetuses: 1 Heart Rate:  No detectable cardiac activity Movement: No Presentation: Transverse Placental Location: Anterior Previa: No Amniotic Fluid (Subjective):  Decreased BPD: 6.6 cm 26 w  3 d MATERNAL FINDINGS: Cervix:  Not visualized Uterus/Adnexae: No abnormality visualized. Ultrasound tech note: Very limited exam. Provider told us tech to stop exam due to baby's shoulder and umbilical cord presenting and no pulse within cord. IMPRESSION: Single intrauterine pregnancy. No detectable cardiac activity. Decreased amniotic fluid. Very limited exam due to patient and fetus condition. This exam is performed on an emergent basis and does not comprehensively evaluate fetal size, dating, or anatomy; follow-up complete OB US should be considered if further fetal assessment is warranted. Electronically Signed   By: Caprice Renshaw M.D.   On: 06/04/2021 15:43    Assessment:  Suzanne Rhodes is a 40 y.o. [id number] female at [redacted]w[redacted]d with IUFD, preterm labor, pPROM, and  prolapsed cord.   Plan:  1. Admit to Labor & Delivery; consents reviewed and obtained - Covid admission screen negative  - Reviewed admission with Dr. Valentino Saxon - Will allow time for fetal presentation to turn to either cephalic or breech.  Consider assistance with turning if still transverse when completely dilated.   2. Intrauterine fetal demise  - No FHT seen on [place]  - Offered spiritual support - declines at this time  - Plan for infant mementos   3. Routine OB: - Prenatal labs obtained  - Rh pos - CBC, T&S, RPR on admit - Clear fluids, IVF - UDS pending   4. Monitoring of labor  - Contractions monitored with external toco - Pelvis adequate for trial of labor  - Plan  for expectant management  - Maternal pain control as desired; planning regional anesthesia - IVPM given until able to get epidural  - Anticipate vaginal delivery  5. Post Partum Planning: - Contraception: Undecided  - Tdap vaccine: not done - Flu vaccine: not done  - Transition of Care team consult placed   Gustavo Lah, CNM 06/04/21 3:47 PM  [name], CNM Certified Nurse Midwife [place]  [facility]

## 2021-06-04 NOTE — Discharge Summary (Signed)
Obstetrical Discharge Summary  Patient Name: Suzanne Rhodes DOB: 1980-09-22 MRN: [id number]  Date of Admission: 06/04/2021 Date of Delivery: 06/04/2021 Delivered by: [name], CNM  Date of Discharge: 06/04/2021  Primary OB: [facility] OIN:[facility] last menstrual period was 12/01/2020 (within weeks). EDC Estimated Date of Delivery: 09/07/21 Gestational Age at Delivery: [redacted]w[redacted]d   Antepartum complications:  No prenatal care  History of cesarean birth followed by VBAC  Tobacco use in pregnancy  History of preterm birth x 5 History of SIDS at 3 months   Admitting Diagnosis: Preterm labor [O60.00]  Secondary Diagnosis: Patient Active Problem List   Diagnosis Date Noted   Preterm labor 06/04/2021   Fetal demise > 22 weeks, delivered, current hospitalization 06/04/2021   Preterm premature rupture of membranes, antepartum 12/16/2016   History of preterm delivery, currently pregnant 22-Dec-2016   History of cesarean delivery, currently pregnant 12-22-2016   History of sudden infant death syndrome in family Dec 22, 2016   Smoking (tobacco) complicating childbirth December 22, 2016   Insufficient prenatal care 2016/12/22   Tobacco smoking affecting pregnancy in second trimester 10/02/2016    Augmentation: N/A Complications: Stillborn Intrapartum complications/course: Suzanne Rhodes presented to L&D in active preterm labor, SROM, and no FHT's on admission.  IUFD was confirmed with [place]. Prolapsed corded with no pulse noted during exam.  She progressed quickly with a strong urge to push. Delivered frank breech with prolapsed cord.  Delivery Type: vaginal birth after cesarean (VBAC) Anesthesia: IVPM Placenta: spontaneous Laceration: none Episiotomy: none Newborn Data: Live born female "Amajae Skye" Birth Weight: 2 lb 1.2 oz (940 g) APGAR: 0, 0  Newborn Delivery   Birth date/time: 06/04/2021 16:01:00 Delivery type: Vaginal, Spontaneous      Postpartum Procedures: none [place]:  Not completed prior to discharge    Post partum course:  Patient had an uncomplicated postpartum course.  By time of discharge on PPD#0, her pain was controlled on oral pain medications; she had appropriate lochia and was ambulating, voiding without difficulty and tolerating regular diet.  She was offered to stay overnight for further monitoring but Suzanne Rhodes declined.  Suzanne Rhodes strongly desires to discharge home now.  She was deemed stable for discharge to home.    Discharge Physical Exam:  BP 139/76    Pulse (!) 108    LMP 12/01/2020 (Within Weeks)    SpO2 98%   General: NAD CV: RRR Pulm: CTABL, nl effort ABD: s/nd/nt, fundus firm and below the umbilicus Lochia: moderate Perineum: minimal edema/intact DVT Evaluation: LE non-ttp, no evidence of DVT on exam.  Hemoglobin  Date Value Ref Range Status  06/04/2021 11.6 (L) 12.0 - 15.0 g/dL Final   HGB  Date Value Ref Range Status  05/31/2014 9.1 (L) 12.0 - 16.0 g/dL Final   HCT  Date Value Ref Range Status  06/04/2021 34.8 (L) 36.0 - 46.0 % Final  05/30/2014 30.2 (L) 35.0 - 47.0 % Final     Disposition: stable, discharge to home. Baby Disposition: to morgue   Rh Immune globulin given: Rh Pos Rubella vaccine given: Pending  Varivax vaccine given: Pending  Flu vaccine given in AP or PP setting: declined  Tdap vaccine given in AP or PP setting: declined   Contraception: Undecided   Prenatal Labs:  Pending   Plan:  Glenn Christo was discharged to home in good condition. Follow-up appointment with delivering provider in 2 weeks.  Discharge Medications: Allergies as of 06/04/2021   No Known Allergies      Medication List     TAKE  these medications    ibuprofen 600 MG tablet Commonly known as: ADVIL Take 1 tablet (600 mg total) by mouth every 6 (six) hours as needed for mild pain or cramping.   multivitamin-prenatal 27-0.8 MG Tabs tablet Take 1 tablet by mouth daily at 12 noon.         Follow-up  Information     Gustavo Lah, CNM. Schedule an appointment as soon as possible for a visit in 2 week(s).   Specialty: Certified Nurse Midwife Why: postpartum mood check Contact information: [address] [place] [address] [phone]                 Signed:  [name], CNM Certified Nurse Midwife [facility]

## 2021-06-04 NOTE — ED Notes (Signed)
Recieved patient via EMS from [facility]. Reported she was  standing in the kitchen and felt like she needed to voi8d. Water broke. Went to bathroom and had bright red blood noted. No trauma or falls. BP 142/87 Hr 100  O2 Sat 100. Did not complete triage EDP present and state transport patient o L&D. Nurses from L&D took patient up stairs. Patient alert and oriented. States no prenatal care.

## 2021-06-05 LAB — RPR: RPR Ser Ql: NONREACTIVE

## 2021-06-06 ENCOUNTER — Telehealth: Payer: Self-pay | Admitting: Family Medicine

## 2021-06-06 LAB — HCV AB W REFLEX TO QUANT PCR: HCV Ab: 0.1 s/co ratio (ref 0.0–0.9)

## 2021-06-06 LAB — HCV INTERPRETATION

## 2021-06-06 NOTE — Telephone Encounter (Signed)
Patient delivered her baby yesterday and wants to get her tubes tied. She only had the pregnancy test done here, but no prenatal care here or anywhere else. The doctor instructed her to call us first, in order for them to proceed with her tubes. Please call her.

## 2021-06-06 NOTE — Telephone Encounter (Signed)
Return call to client at cell and home number. Per recorded message, number has either been changed, discontinued or is no longer in service.Jossie Ng, RN

## 2021-06-07 LAB — RUBELLA SCREEN: Rubella: 2.73 index (ref >0.99)

## 2021-06-07 LAB — VARICELLA ZOSTER ANTIBODY, IGG: Varicella IgG: 579 index (ref >165)

## 2021-06-14 LAB — SURGICAL PATHOLOGY

## 2021-09-01 ENCOUNTER — Emergency Department
Admission: EM | Admit: 2021-09-01 | Discharge: 2021-09-01 | Disposition: A | Discharge status: Home / Self Care | Payer: Self-pay | Attending: Emergency Medicine | Admitting: Emergency Medicine

## 2021-09-01 ENCOUNTER — Other Ambulatory Visit: Payer: Self-pay

## 2021-09-01 ENCOUNTER — Emergency Department: Payer: Self-pay

## 2021-09-01 DIAGNOSIS — Z20822 Contact with and (suspected) exposure to covid-19: Secondary | ICD-10-CM | POA: Insufficient documentation

## 2021-09-01 DIAGNOSIS — R591 Generalized enlarged lymph nodes: Secondary | ICD-10-CM | POA: Insufficient documentation

## 2021-09-01 LAB — RESP PANEL BY RT-PCR (FLU A&B, COVID) ARPGX2
Influenza A by PCR: NEGATIVE
Influenza B by PCR: NEGATIVE
SARS Coronavirus 2 by RT PCR: NEGATIVE

## 2021-09-01 LAB — BASIC METABOLIC PANEL
Anion gap: 9 (ref 5–15)
BUN: 11 mg/dL (ref 6–20)
CO2: 27 mmol/L (ref 22–32)
Calcium: 9.3 mg/dL (ref 8.9–10.3)
Chloride: 103 mmol/L (ref 98–111)
Creatinine, Ser: 0.74 mg/dL (ref 0.44–1.00)
GFR, Estimated: >60 mL/min (ref >60)
Glucose, Bld: 167 mg/dL — ABNORMAL HIGH (ref 70–99)
Potassium: 3.7 mmol/L (ref 3.5–5.1)
Sodium: 139 mmol/L (ref 135–145)

## 2021-09-01 LAB — CBC WITH DIFFERENTIAL/PLATELET
Abs Immature Granulocytes: 0.04 10*3/uL (ref 0.00–0.07)
Basophils Absolute: 0 10*3/uL (ref 0.0–0.1)
Basophils Relative: 0 %
Eosinophils Absolute: 0.2 10*3/uL (ref 0.0–0.5)
Eosinophils Relative: 2 %
HCT: 43.6 % (ref 36.0–46.0)
Hemoglobin: 14 g/dL (ref 12.0–15.0)
Immature Granulocytes: 0 %
Lymphocytes Relative: 24 %
Lymphs Abs: 2.3 10*3/uL (ref 0.7–4.0)
MCH: 27.5 pg (ref 26.0–34.0)
MCHC: 32.1 g/dL (ref 30.0–36.0)
MCV: 85.5 fL (ref 80.0–100.0)
Monocytes Absolute: 0.5 10*3/uL (ref 0.1–1.0)
Monocytes Relative: 5 %
Neutro Abs: 6.5 10*3/uL (ref 1.7–7.7)
Neutrophils Relative %: 69 %
Platelets: 311 10*3/uL (ref 150–400)
RBC: 5.1 MIL/uL (ref 3.87–5.11)
RDW: 13.5 % (ref 11.5–15.5)
WBC: 9.6 10*3/uL (ref 4.0–10.5)
nRBC: 0 % (ref 0.0–0.2)

## 2021-09-01 LAB — GROUP A STREP BY PCR: Group A Strep by PCR: NOT DETECTED

## 2021-09-01 MED ORDER — PREDNISONE 10 MG (21) PO TBPK: ORAL_TABLET | ORAL | 0 refills | Status: AC

## 2021-09-01 MED ORDER — IOHEXOL 300 MG/ML  SOLN
75.0000 mL | Freq: Once | INTRAMUSCULAR | Status: AC | PRN
  Administered 2021-09-01: 75 mL via INTRAVENOUS
  Filled 2021-09-01: qty 75

## 2021-09-01 NOTE — Discharge Instructions (Signed)
Please follow up with the ENT specialist if not improving over the week with medication. ? ?Return to the ER for symptoms that change or worsen if unable to schedule an appointment. ?

## 2021-09-01 NOTE — ED Provider Notes (Signed)
? ?[facility] ?Provider Note ? ? ? Event Date/Time  ? First MD Initiated Contact with Patient 09/01/21 1642   ?  (approximate) ? ? ?History  ? ?Abscess ? ? ?HPI ? ?Suzanne Rhodes is a 41 y.o. female with no chronic medical history and as listed in EMR presents to the emergency department for swelling on right lateral neck. Symptoms have been present for the past month. Sleeping on that side makes pain worse. She has noticed some voice changes and occasionally difficulty swallowing for the past few days. Area has increased in size. No fever or recent illness. ? ?  ? ? ?Physical Exam  ? ?Triage Vital Signs: ?ED Triage Vitals  ?Enc Vitals Group  ?   BP 09/01/21 1601 137/85  ?   Pulse Rate 09/01/21 1601 89  ?   Resp 09/01/21 1601 18  ?   Temp 09/01/21 1601 98.1 ?F (36.7 ?C)  ?   Temp src --   ?   SpO2 09/01/21 1601 95 %  ?   Weight 09/01/21 1645 167 lb 15.9 oz (76.2 kg)  ?   Height 09/01/21 1645 5\' 2"  (1.575 m)  ?   Head Circumference --   ?   Peak Flow --   ?   Pain Score 09/01/21 1603 3  ?   Pain Loc --   ?   Pain Edu? --   ?   Excl. in GC? --   ? ? ?Most recent vital signs: ?Vitals:  ? 09/01/21 1601 09/01/21 1743  ?BP: 137/85 129/62  ?Pulse: 89 87  ?Resp: 18 17  ?Temp: 98.1 ?F (36.7 ?C)   ?SpO2: 95% 99%  ? ? ?General: Awake, no distress.  ?CV:  Good peripheral perfusion.  ?Resp:  Normal effort.  ?Abd:  No distention.  ?Other:  Right-sided anterior cervical lymphadenopathy palpable on exam.  Airway is patent.  Uvula is midline.  Tonsils are unremarkable. ? ? ?ED Results / Procedures / Treatments  ? ?Labs ?(all labs ordered are listed, but only abnormal results are displayed) ?Labs Reviewed  ?BASIC METABOLIC PANEL - Abnormal; Notable for the following components:  ?    Result Value  ? Glucose, Bld 167 (*)   ? All other components within normal limits  ?RESP PANEL BY RT-PCR (FLU A&B, COVID) [id number]  ?GROUP A STREP BY PCR  ?CBC WITH DIFFERENTIAL/PLATELET  ? ? ? ?EKG ? ? ? ? ?RADIOLOGY ? ?Image and  radiology report reviewed by me. ? ?CT soft tissue neck shows both parotid glands generally enlarged with relative low density which could potentially be seen with parotitis.  No evidence of a focal parotid mass.  Prominent right level 2 lymph node is likely reactive.  No other cervical lymph nodes noted. ? ?PROCEDURES: ? ?Critical Care performed: No ? ?Procedures ? ? ?MEDICATIONS ORDERED IN ED: ?Medications  ?iohexol (OMNIPAQUE) 300 MG/ML solution 75 mL (75 mLs Intravenous Contrast Given 09/01/21 1833)  ? ? ? ?IMPRESSION / MDM / ASSESSMENT AND PLAN / ED COURSE  ? ?I have reviewed the triage note. ? ?Differential diagnosis includes, but is not limited to, lymphadenopathy, lymphangitis, I strep, COVID, influenza, lymphoma. ? ?41 year old female presenting to the emergency department for treatment and evaluation of right side neck pain that has been present for the past month or so.  She has noticed increase in swelling of the area over the past few days.  See HPI for further details. ? ?Plan will be to get some labs  and a CT soft tissue neck.  We will also get a strep screen, COVID test, and influenza test.  Patient is aware and agreeable to the plan. ? ?CT soft tissue neck shows reactive lymph node in the area of concern.  Plan will be to treat her with a tapered prednisone pack and have her follow-up with ENT if not improving over the week.  If symptoms change or worsen and she is unable to see the ear nose and throat specialist, she is to return to the emergency department. ?  ? ? ?FINAL CLINICAL IMPRESSION(S) / ED DIAGNOSES  ? ?Final diagnoses:  ?Lymphadenopathy, cervical  ? ? ? ?Rx / DC Orders  ? ?ED Discharge Orders   ? ?      Ordered  ?  predniSONE (STERAPRED UNI-PAK 21 TAB) 10 MG (21) TBPK tablet       ? 09/01/21 1855  ? ?  ?  ? ?  ? ? ? ?Note:  This document was prepared using Dragon voice recognition software and may include unintentional dictation errors. ?  ?Chinita Pester, FNP ?09/01/21 1902 ? ?   ?Phineas Semen, MD ?09/01/21 1934 ? ?

## 2021-09-01 NOTE — ED Triage Notes (Signed)
Pt states that she has a bump on the side of her neck on the right side- pt states it has been there for months and it goes away and comes back but this time it is bigger than normal ?

## 2022-08-26 IMAGING — CT CT NECK W/ CM
4 of 6 series · 12 of 33 positions shown, 14 images · IV contrast (APPLIED)
Comparison: None.

CLINICAL DATA: Non pulsatile neck mass on the right side.

EXAM:
CT NECK WITH CONTRAST
TECHNIQUE: Multidetector CT imaging of the neck was performed using the
standard protocol following the bolus administration of intravenous
contrast.

[Series 4: axial bone · axial · 0.47mm/px · z∈[+1478,+1544]mm · 2 of 100 slices shown, 3 images]
[im 34/100  soft-tissue]
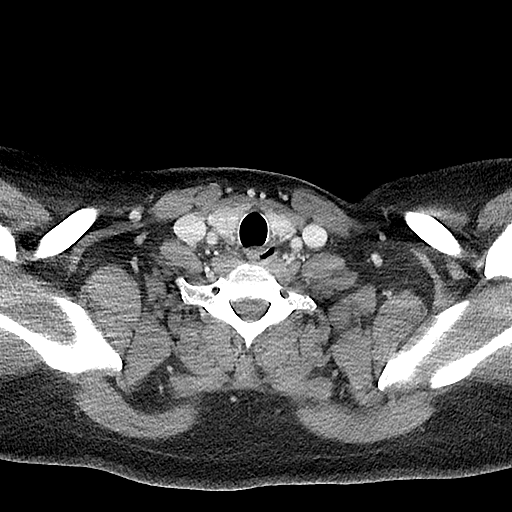
[im 34/100  bone]
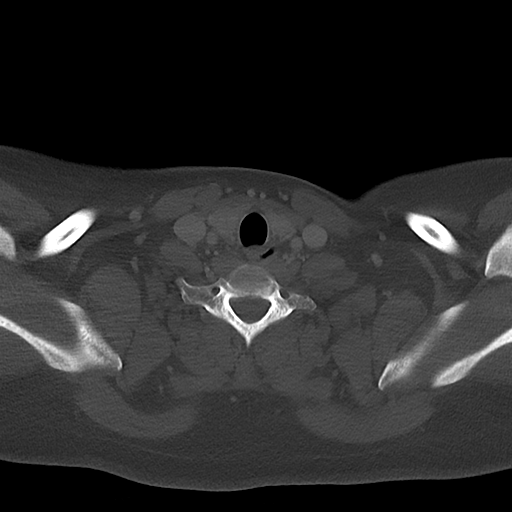
[im 67/100  bone]
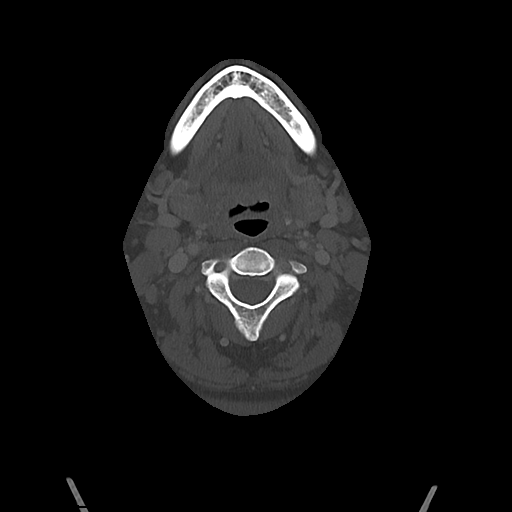

[Series 5: sag neck · sagittal · 0.40mm/px · 5 of 68 slices shown, 6 images]
[im 23/68  bone]
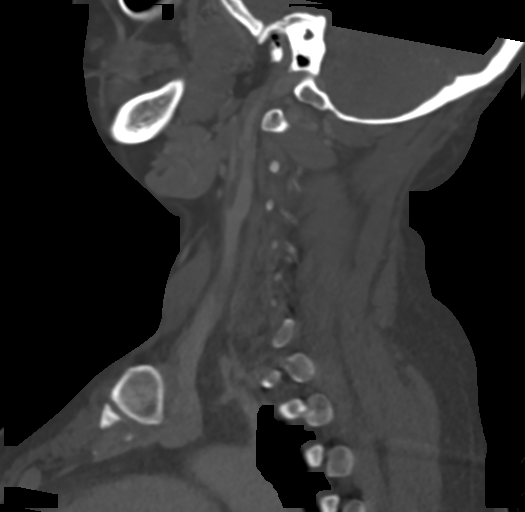
[im 28/68  bone]
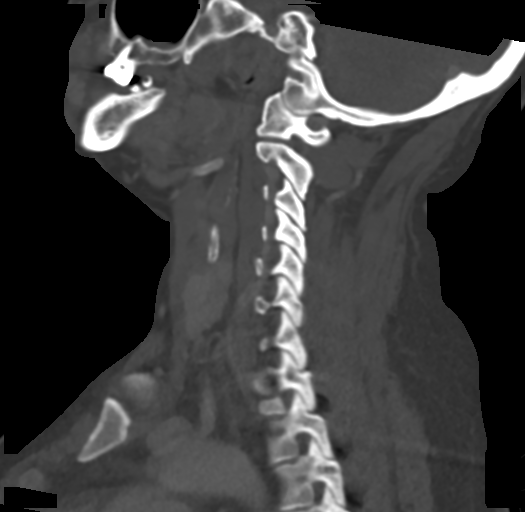
[im 34/68  soft-tissue]
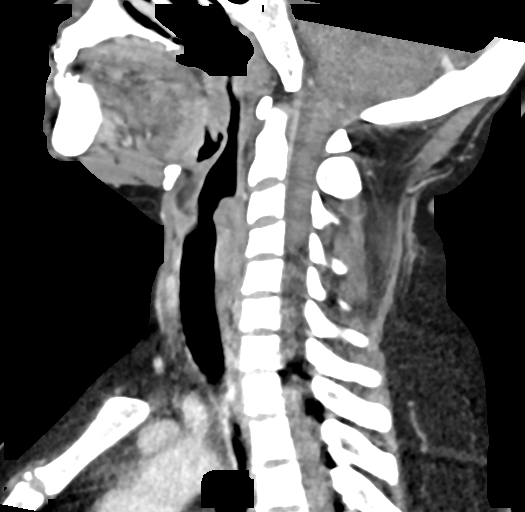
[im 34/68  bone]
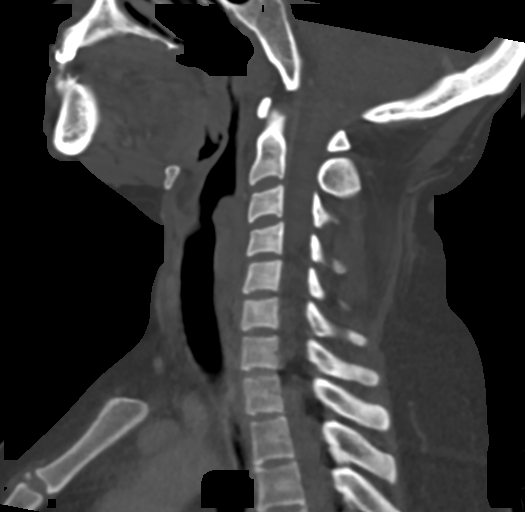
[im 40/68  bone]
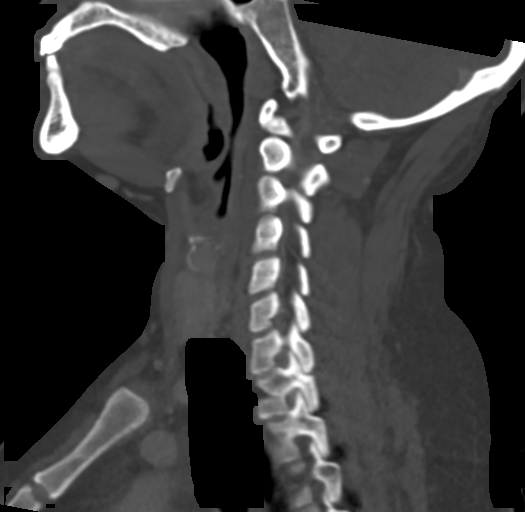
[im 45/68  bone]
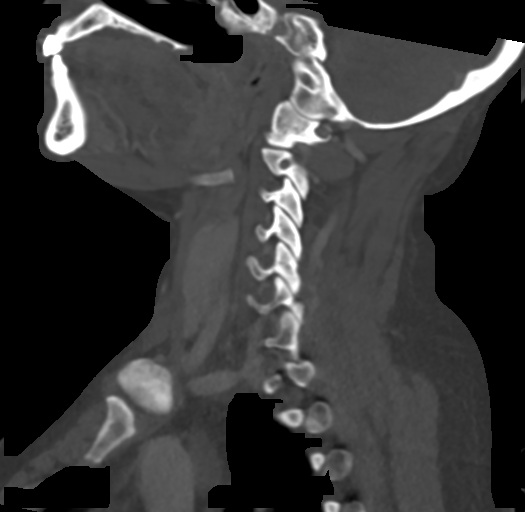

[Series 6: cor neck · coronal · 0.35mm/px · 3 of 85 slices shown]
[im 17/85  bone]
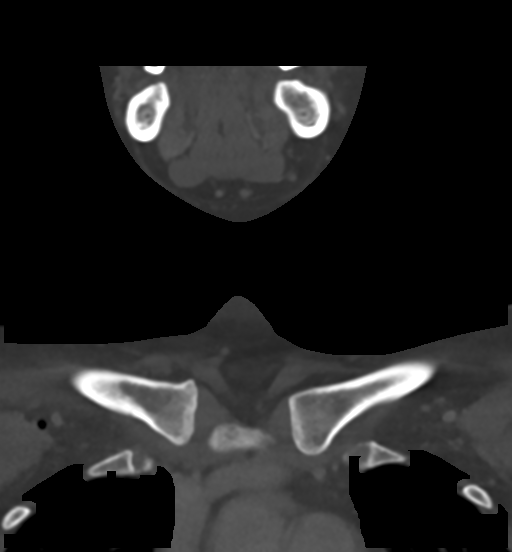
[im 34/85  bone]
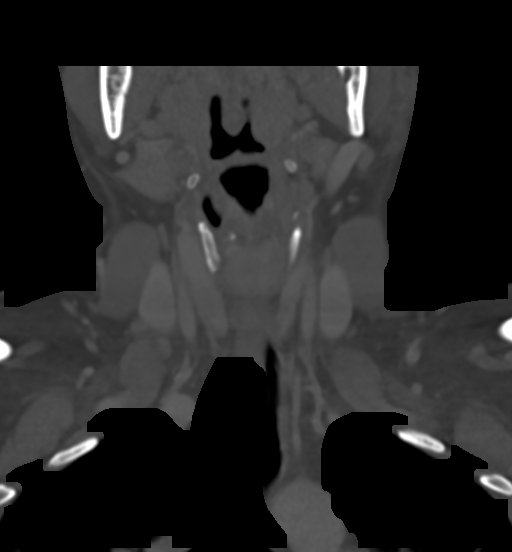
[im 51/85  bone]
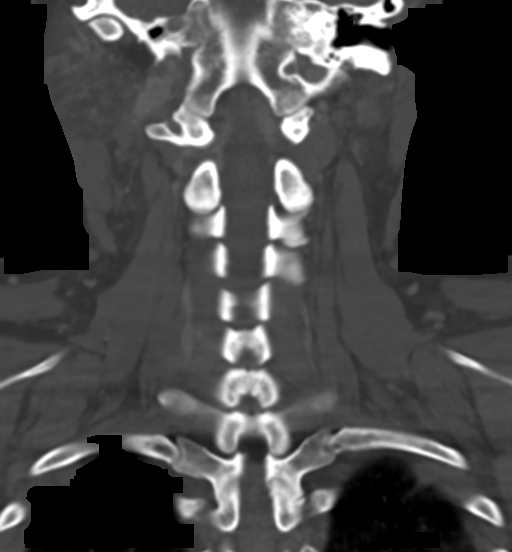

[Series 7: ax oropharynx · axial · 0.39mm/px · z∈[+1454,+1518]mm · 2 of 100 slices shown]
[im 34/100  bone]
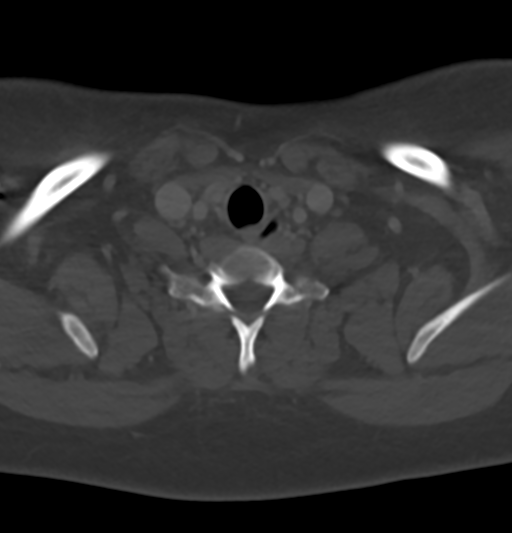
[im 67/100  bone]
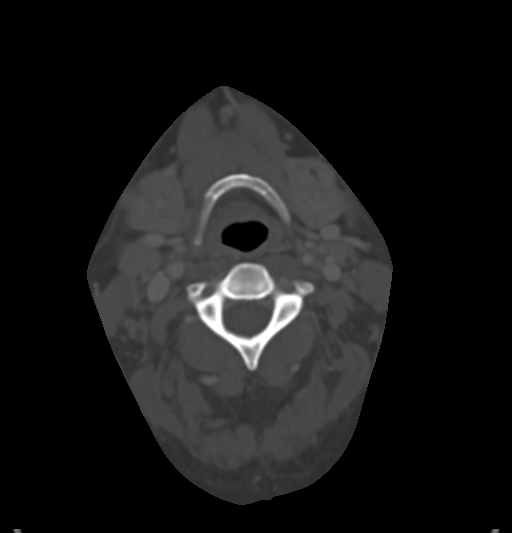

[12 of 33 positions shown; findings below may reference images not displayed]

RADIATION DOSE REDUCTION: This exam was performed according to the
departmental dose-optimization program which includes automated
exposure control, adjustment of the mA and/or kV according to
patient size and/or use of iterative reconstruction technique.

CONTRAST:  75mL OMNIPAQUE IOHEXOL 300 MG/ML  SOLN
FINDINGS: Pharynx and larynx: No evidence of mucosal or submucosal lesion.

Salivary glands: Submandibular glands are normal. Question mild
enlargement and low density of the parotid glands that could go
along with parotid swelling. The technologist did not mark the
location of the patient's concern. Is that in the region of the
parotid gland? Parotid enlargement can be seen due to viral
inflammation or autoimmune disease.

Thyroid: Normal

Lymph nodes: No lymphadenopathy on the left. All visualized nodes
are within normal limits in short axis dimension. On the right,
there is a single level 2 node with a short axis dimension of 12 mm
which may be reactive. There is no low-density. No second abnormal
lymph node on the right.

Vascular: No abnormal vascular finding.

Limited intracranial: Normal

Visualized orbits: Not included

Mastoids and visualized paranasal sinuses: Mild inflammatory change
of the right maxillary sinus mucosa. Otherwise negative.

Skeleton: Normal

Upper chest: Normal

Other: None
IMPRESSION: No skin marker was put in place by the technologist therefore I do
not know exactly where the patient's level of concern is.

Both parotid glands appear generally enlarged with relative low
density. This could be seen with parotitis and could be due to viral
disease or autoimmune disease. No evidence of a focal parotid mass.

Prominent right level 2 lymph node, short axis dimension 12 mm. No
low density. This is most likely a reactive node. No other cervical
lymph node on either side of the neck shows abnormal short axis
dimension.
